# Patient Record
Sex: Male | Born: 1985 | Hispanic: No | Marital: Married | Smoking: Current every day smoker
Health system: Southern US, Community
[De-identification: ages and names within clinical notes are randomized; demographics above are authoritative.]

## PROBLEM LIST (undated history)

## (undated) DIAGNOSIS — D751 Secondary polycythemia: Principal | ICD-10-CM

## (undated) DIAGNOSIS — T7840XA Allergy, unspecified, initial encounter: Secondary | ICD-10-CM

## (undated) DIAGNOSIS — I10 Essential (primary) hypertension: Secondary | ICD-10-CM

## (undated) DIAGNOSIS — K219 Gastro-esophageal reflux disease without esophagitis: Secondary | ICD-10-CM

## (undated) HISTORY — DX: Allergy, unspecified, initial encounter: T78.40XA

## (undated) HISTORY — DX: Gastro-esophageal reflux disease without esophagitis: K21.9

## (undated) HISTORY — DX: Essential (primary) hypertension: I10

## (undated) HISTORY — PX: NASAL SEPTUM SURGERY: SHX37

## (undated) HISTORY — DX: Secondary polycythemia: D75.1

---

## 2011-04-24 ENCOUNTER — Encounter (INDEPENDENT_AMBULATORY_CARE_PROVIDER_SITE_OTHER): Payer: Self-pay | Admitting: General Surgery

## 2011-04-25 ENCOUNTER — Encounter (INDEPENDENT_AMBULATORY_CARE_PROVIDER_SITE_OTHER): Payer: Self-pay | Admitting: Surgery

## 2011-04-25 ENCOUNTER — Ambulatory Visit (INDEPENDENT_AMBULATORY_CARE_PROVIDER_SITE_OTHER): Payer: PRIVATE HEALTH INSURANCE | Admitting: Surgery

## 2011-04-25 DIAGNOSIS — D179 Benign lipomatous neoplasm, unspecified: Secondary | ICD-10-CM | POA: Insufficient documentation

## 2011-04-25 NOTE — Progress Notes (Signed)
  CC: Lump on left side HPI: The patient comes over from his primary physician with a mass on the left flank. The patient says it has been present for four or five years and he does not think it has gotten any larger over the last one year. He's not had any pain from it and it really doesn't bother him particularly at this time.   ROS: Essentially negative. He has filled out the ROS form.  MEDS: No current outpatient prescriptions on file.     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 I told him that it was safe to follow this up and if it enlarges or becomes symptomatic then we would reevaluate and likely recommend excision. He was agreeable and was not anxious for surgery at this time. He'll come back to see [place] if there are any changes in the mass or in his symptoms.  CC: Florentina Jenny, MD

## 2011-04-25 NOTE — Patient Instructions (Signed)
If the lipoma starts to cause pain or discomfort or seems to be getting bigger we should surgically remove it. Otherwise it can be left alone

## 2013-09-02 ENCOUNTER — Encounter: Payer: Self-pay | Admitting: *Deleted

## 2013-09-03 ENCOUNTER — Encounter: Payer: Self-pay | Admitting: Cardiology

## 2013-09-03 ENCOUNTER — Ambulatory Visit (INDEPENDENT_AMBULATORY_CARE_PROVIDER_SITE_OTHER): Payer: No Typology Code available for payment source | Admitting: Cardiology

## 2013-09-03 VITALS — BP 120/70 | HR 56 | Ht 69.0 in | Wt 197.0 lb

## 2013-09-03 DIAGNOSIS — R072 Precordial pain: Secondary | ICD-10-CM

## 2013-09-03 DIAGNOSIS — I209 Angina pectoris, unspecified: Secondary | ICD-10-CM

## 2013-09-03 DIAGNOSIS — R079 Chest pain, unspecified: Secondary | ICD-10-CM

## 2013-09-03 NOTE — Patient Instructions (Signed)
Your physician recommends that you schedule a follow-up appointment in: one year with Dr. Percival Spanish  We are scheduling a stress test

## 2013-09-03 NOTE — Progress Notes (Signed)
HPI The patient presents for evaluation of chest discomfort. He has no past cardiac history although he does have cardiovascular risk factors with a family history and tobacco abuse. He said that about a month ago he was having more significant chest discomfort almost daily over a couple of times daily. He did describe a pain under his left chest which was lasting for minutes area did seem to be sharp. It would be worse with activities and inspiration. It would go away spontaneously. He didn't describe associated symptoms. However, it seems to be better now is only happening a couple of times weekly. He tries to be active and doesn't always bring this on with activity. He can actually run without this coming on. He doesn't describe any resting symptoms. He has no PND or orthopnea. He's not describing palpitations, presyncope or syncope. He's had no prior cardiac workup.  No Known Allergies  Current Outpatient Prescriptions  Medication Sig Dispense Refill  . aspirin 325 MG tablet Take 325 mg by mouth daily.       No current facility-administered medications for this visit.    Past Medical History  Diagnosis Date  . Mass     on back  . Lipoma 04/25/2011    No past surgical history on file.  Family History  Problem Relation Age of Onset  . Heart disease Mother   . Diabetes Mother     History   Social History  . Marital Status: Married    Spouse Name: N/A    Number of Children: N/A  . Years of Education: N/A   Occupational History  . Not on file.   Social History Main Topics  . Smoking status: Current Every Day Smoker -- 1.00 packs/day  . Smokeless tobacco: Not on file  . Alcohol Use: No  . Drug Use: No  . Sexual Activity: Not on file   Other Topics Concern  . Not on file   Social History Narrative  . No narrative on file    ROS:  As stated in the HPI and negative for all other systems.  PHYSICAL EXAM BP 120/70  Pulse 56  Ht 5\' 9"  (1.753 m)  Wt 197 lb (89.359  kg)  BMI 29.08 kg/m2 GENERAL:  Well appearing HEENT:  Pupils equal round and reactive, fundi not visualized, oral mucosa unremarkable NECK:  No jugular venous distention, waveform within normal limits, carotid upstroke brisk and symmetric, no bruits, no thyromegaly LYMPHATICS:  No cervical, inguinal adenopathy LUNGS:  Clear to auscultation bilaterally BACK:  No CVA tenderness CHEST:  Unremarkable HEART:  PMI not displaced or sustained,S1 and S2 within normal limits, no S3, no S4, no clicks, no rubs, no murmurs ABD:  Flat, positive bowel sounds normal in frequency in pitch, no bruits, no rebound, no guarding, no midline pulsatile mass, no hepatomegaly, no splenomegaly EXT:  2 plus pulses throughout, no edema, no cyanosis no clubbing SKIN:  No rashes no nodules NEURO:  Cranial nerves II through XII grossly intact, motor grossly intact throughout PSYCH:  Cognitively intact, oriented to person place and time   EKG:  Sinus rhythm, rate 56, axis within normal limits, intervals within normal limits, no acute ST-T wave changes.  09/03/2013  ASSESSMENT AND PLAN  CHEST PAIN:  This is atypical.  However, he does have risk factors.   I will bring the patient back for a POET (Plain Old Exercise Test). This will allow me to screen for obstructive coronary disease, risk stratify and very importantly provide  a prescription for exercise.  TOBACCO ABUSE:  We discussed a specific strategy for tobacco cessation.  (Greater than three minutes discussing tobacco cessation.)

## 2013-09-16 ENCOUNTER — Telehealth (HOSPITAL_COMMUNITY): Payer: Self-pay

## 2013-09-16 NOTE — Telephone Encounter (Signed)
Encounter complete. 

## 2013-09-18 ENCOUNTER — Ambulatory Visit (HOSPITAL_COMMUNITY)
Admission: RE | Admit: 2013-09-18 | Discharge: 2013-09-18 | Disposition: A | Discharge status: Home / Self Care | Payer: No Typology Code available for payment source | Source: Ambulatory Visit | Attending: Cardiology | Admitting: Cardiology

## 2013-09-18 DIAGNOSIS — I209 Angina pectoris, unspecified: Secondary | ICD-10-CM

## 2013-09-18 DIAGNOSIS — R079 Chest pain, unspecified: Secondary | ICD-10-CM | POA: Insufficient documentation

## 2013-09-18 NOTE — Procedures (Signed)
Exercise Treadmill Test  Pre-Exercise Testing Evaluation   Test  Exercise Tolerance Test Ordering MD: Marijo File, MD    Unique Test No: 1   Treadmill:  1  Indication for ETT: chest pain - rule out ischemia  Contraindication to ETT: No   Stress Modality: exercise - treadmill  Cardiac Imaging Performed: non   Protocol: standard Bruce - maximal  Max BP:  178/96  Max MPHR (bpm):  191 85% MPR (bpm):  162  MPHR obtained (bpm):  164 % MPHR obtained:  85  Reached 85% MPHR (min:sec):  13 Total Exercise Time (min-sec):  13  Workload in METS:  1506 Borg Scale: 15  Reason ETT Terminated:  dyspnea    ST Segment Analysis At Rest: normal ST segments - no evidence of significant ST depression With Exercise: no evidence of significant ST depression  Other Information Arrhythmia:  No Angina during ETT:  absent (0) Quality of ETT:  diagnostic  ETT Interpretation:  normal - no evidence of ischemia by ST analysis  Comments: The patient had an excellent exercise tolerance.  There was no chest pain.  There was an appropriate level of dyspnea.  There were no arrhythmias, a normal heart rate response and normal BP response.  There were no ischemic ST T wave changes and a normal heart rate recovery.  Recommendations: Negative adequate ETT.  No further testing is indicated.

## 2014-04-15 ENCOUNTER — Encounter: Payer: Self-pay | Admitting: Hematology & Oncology

## 2014-04-27 ENCOUNTER — Telehealth: Payer: Self-pay | Admitting: Hematology & Oncology

## 2014-04-27 NOTE — Telephone Encounter (Signed)
I tried to call NEW PATIENT today to remind them of their appointment with Dr. Marin Olp. Also, advised them to bring all medication bottles and insurance card information.  However, someone hung up when I asked for pt. Not really sure, but I don't think they was able to understand me good.

## 2014-04-28 ENCOUNTER — Ambulatory Visit (HOSPITAL_BASED_OUTPATIENT_CLINIC_OR_DEPARTMENT_OTHER): Payer: No Typology Code available for payment source | Admitting: Hematology & Oncology

## 2014-04-28 ENCOUNTER — Encounter: Payer: Self-pay | Admitting: Hematology & Oncology

## 2014-04-28 ENCOUNTER — Other Ambulatory Visit (HOSPITAL_BASED_OUTPATIENT_CLINIC_OR_DEPARTMENT_OTHER): Payer: No Typology Code available for payment source | Admitting: Lab

## 2014-04-28 ENCOUNTER — Ambulatory Visit: Payer: No Typology Code available for payment source

## 2014-04-28 VITALS — BP 139/85 | HR 89 | Temp 98.0°F | Resp 18 | Ht 69.0 in | Wt 217.0 lb

## 2014-04-28 DIAGNOSIS — R945 Abnormal results of liver function studies: Secondary | ICD-10-CM

## 2014-04-28 DIAGNOSIS — D751 Secondary polycythemia: Secondary | ICD-10-CM

## 2014-04-28 DIAGNOSIS — R7989 Other specified abnormal findings of blood chemistry: Secondary | ICD-10-CM

## 2014-04-28 LAB — CBC WITH DIFFERENTIAL (CANCER CENTER ONLY)
BASO#: 0 10*3/uL (ref 0.0–0.2)
BASO%: 0.2 % (ref 0.0–2.0)
EOS%: 5.5 % (ref 0.0–7.0)
Eosinophils Absolute: 0.4 10*3/uL (ref 0.0–0.5)
HCT: 53.1 % — ABNORMAL HIGH (ref 38.7–49.9)
HEMOGLOBIN: 19.2 g/dL — AB (ref 13.0–17.1)
LYMPH#: 2.5 10*3/uL (ref 0.9–3.3)
LYMPH%: 31.1 % (ref 14.0–48.0)
MCH: 30.7 pg (ref 28.0–33.4)
MCHC: 36.2 g/dL — AB (ref 32.0–35.9)
MCV: 85 fL (ref 82–98)
MONO#: 0.6 10*3/uL (ref 0.1–0.9)
MONO%: 7.3 % (ref 0.0–13.0)
NEUT%: 55.9 % (ref 40.0–80.0)
NEUTROS ABS: 4.5 10*3/uL (ref 1.5–6.5)
PLATELETS: 203 10*3/uL (ref 145–400)
RBC: 6.25 10*6/uL — ABNORMAL HIGH (ref 4.20–5.70)
RDW: 14.4 % (ref 11.1–15.7)
WBC: 8.1 10*3/uL (ref 4.0–10.0)

## 2014-04-28 LAB — CMP (CANCER CENTER ONLY)
ALT: 56 U/L — AB (ref 10–47)
AST: 39 U/L — AB (ref 11–38)
Albumin: 3.7 g/dL (ref 3.3–5.5)
Alkaline Phosphatase: 99 U/L — ABNORMAL HIGH (ref 26–84)
BILIRUBIN TOTAL: 1.1 mg/dL (ref 0.20–1.60)
BUN, Bld: 11 mg/dL (ref 7–22)
CO2: 28 mEq/L (ref 18–33)
Calcium: 9.2 mg/dL (ref 8.0–10.3)
Chloride: 105 mEq/L (ref 98–108)
Creat: 0.8 mg/dl (ref 0.6–1.2)
Glucose, Bld: 98 mg/dL (ref 73–118)
Potassium: 4 mEq/L (ref 3.3–4.7)
Sodium: 145 mEq/L (ref 128–145)
Total Protein: 6.9 g/dL (ref 6.4–8.1)

## 2014-04-28 LAB — CHCC SATELLITE - SMEAR

## 2014-04-28 NOTE — Progress Notes (Signed)
Referral MD  Reason for Referral: Erythrocytosis   Chief Complaint  Patient presents with  . NEW PATIENT  : My blood count is not right.  HPI: Austin Poole is a very nice 29 year old [place] male. He's been in the [place] for 11 years. He works in Engineer, drilling.  He is seen by Dr. Alroy Dust. Dr. Alroy Dust has been doing some lab work on him. He has noted that his red cell count has been on the high side. He had a CBC done back in February. This showed a white cell count 8.1 hemoglobin 19.5 hematocrit 56 and platelet count 203,000. MCV was 89. He had a normal white cell differential.  Back in September 2015, his hemoglobin was 18.4 and hematocrit 52.6%. MCV was 88.  In June 2015, his hemoglobin was 18.2 and hematocrit was 51.4%.  In addition, Dr. Alroy Dust has found that there are some slightly elevated liver function studies. Austin Poole has never had any blood transfusions. He has no risk factors for hepatitis or HIV.  Austin Poole does smoke. He smokes about a pack per day. If he does work out quite a bit. He does not use any type of supplements or steroids to try to get big.  He does not have any pruritus. He has no headache. He has no visual issues. He has no abdominal pain. There is no nausea or vomiting. He's had no change in bowel or bladder habits.  He is not a vegetarian.  He does not drink alcohol.  We are asked to see him to try to help with the erythrocytosis.    Past Medical History  Diagnosis Date  . Lipoma 04/25/2011  :  No past surgical history on file.:   Current outpatient prescriptions:  .  aspirin 325 MG tablet, Take 325 mg by mouth daily., Disp: , Rfl: :  :  No Known Allergies:  Family History  Problem Relation Age of Onset  . CAD Mother 69    CAD/Stent  . Diabetes Mother   . CAD Father 33    MI  :  History   Social History  . Marital Status: Married    Spouse Name: N/A  . Number of Children: N/A  . Years of Education: N/A    Occupational History  . Not on file.   Social History Main Topics  . Smoking status: Current Every Day Smoker -- 1.00 packs/day for 7 years    Types: Cigarettes    Start date: 04/28/2006  . Smokeless tobacco: Never Used     Comment: 04-23-14  still smoking  . Alcohol Use: No  . Drug Use: No  . Sexual Activity: Not on file   Other Topics Concern  . Not on file   Social History Narrative  :  Pertinent items are noted in HPI.  Exam: @IPVITALS @ Well developed and well-nourished [place] gentleman. Vital signs show temperature of 98. Pulse 89. Blood pressure 139/85. Weight is 217 pounds. Head and neck exam shows no ocular or oral lesions. There are no palpable cervical or supraclavicular lymph nodes. Lungs are clear. Cardiac exam regular rate and rhythm with no murmurs, rubs or bruits. Abdomen is soft. Has good bowel sounds. There is no fluid wave. There is no palpable liver or spleen tip. Back exam shows no tenderness over the spine, ribs or hips. Extremities shows no clubbing, cyanosis or edema. Neurological exam shows no focal neurological deficits. Skin exam shows no rashes, ecchymoses or petechia.    Recent Labs  04/28/14 1211  WBC 8.1  HGB 19.2*  HCT 53.1*  PLT 203    Recent Labs  04/28/14 1211  NA 145  K 4.0  CL 105  CO2 28  GLUCOSE 98  BUN 11  CREATININE 0.8  CALCIUM 9.2    Blood smear review: normochromic and normocytic population of red blood cells. There is no nucleated red blood cells. I see no target cells. He has no teardrop cells. There is no rouleau formation. White cells appear normal in morphology maturation. There is no immature myeloid or lymphoid forms. He has no hypersegmented polys. There is no atypical lymphocytes. Platelets are adequate in number and size. He has good granulation of his platelets.    Assessment and Plan:  Austin Poole is a 29 year old [place] gentleman. He has erythrocytosis.  I wonder if this may not be from him smoking. He  really does not look like he has COPD. He is incredibly fit.  Even though he says he is not taking any supplements or steroids for weight lifting, I will still check his urine.  We will send off a JAK2 assay. I want to make sure that we are not overlooking polycythemia vera.  I'll send off an erythropoietin level and iron studies.  I spent about 45 minutes or so with he and his wife. They're both very nice.  I will try to take care of a lot of this over the phone for right now.  I am not sure why the liver function tests are elevated. I will get a CT scan to see if there is anything going on.  I told him to take a baby aspirin a day for right now. I think this would be very helpful. I think he was on a full dose aspirin but I think a baby aspirin would be appropriate.  I

## 2014-04-29 LAB — IRON AND TIBC CHCC
%SAT: 29 % (ref 20–55)
Iron: 84 ug/dL (ref 42–163)
TIBC: 286 ug/dL (ref 202–409)
UIBC: 202 ug/dL (ref 117–376)

## 2014-04-29 LAB — FERRITIN CHCC: Ferritin: 160 ng/ml (ref 22–316)

## 2014-04-30 LAB — HEMOGLOBINOPATHY EVALUATION
HGB A2 QUANT: 2.6 % (ref 2.2–3.2)
Hemoglobin Other: 0 %
Hgb A: 97.4 % (ref 96.8–97.8)
Hgb F Quant: 0 % (ref 0.0–2.0)
Hgb S Quant: 0 %

## 2014-04-30 LAB — RETICULOCYTES (CHCC)
ABS Retic: 103.2 10*3/uL (ref 19.0–186.0)
RBC.: 6.07 MIL/uL — ABNORMAL HIGH (ref 4.22–5.81)
RETIC CT PCT: 1.7 % (ref 0.4–2.3)

## 2014-04-30 LAB — ERYTHROPOIETIN: Erythropoietin: 9.6 m[IU]/mL (ref 2.6–18.5)

## 2014-05-04 ENCOUNTER — Encounter (HOSPITAL_BASED_OUTPATIENT_CLINIC_OR_DEPARTMENT_OTHER): Payer: Self-pay

## 2014-05-04 ENCOUNTER — Ambulatory Visit (HOSPITAL_BASED_OUTPATIENT_CLINIC_OR_DEPARTMENT_OTHER)
Admission: RE | Admit: 2014-05-04 | Discharge: 2014-05-04 | Disposition: A | Discharge status: Home / Self Care | Payer: No Typology Code available for payment source | Source: Ambulatory Visit | Attending: Hematology & Oncology | Admitting: Hematology & Oncology

## 2014-05-04 DIAGNOSIS — R945 Abnormal results of liver function studies: Secondary | ICD-10-CM | POA: Insufficient documentation

## 2014-05-04 DIAGNOSIS — D751 Secondary polycythemia: Secondary | ICD-10-CM

## 2014-05-04 DIAGNOSIS — R7989 Other specified abnormal findings of blood chemistry: Secondary | ICD-10-CM

## 2014-05-04 DIAGNOSIS — K760 Fatty (change of) liver, not elsewhere classified: Secondary | ICD-10-CM | POA: Insufficient documentation

## 2014-05-04 MED ORDER — IOHEXOL 300 MG/ML  SOLN
100.0000 mL | Freq: Once | INTRAMUSCULAR | Status: AC | PRN
  Administered 2014-05-04: 100 mL via INTRAVENOUS

## 2014-05-05 ENCOUNTER — Telehealth: Payer: Self-pay | Admitting: Nurse Practitioner

## 2014-05-05 LAB — ANABOLIC STEROIDS SCREEN, URINE
Bolasterone: NOT DETECTED ng/mL
Boldenone: NOT DETECTED ng/mL
CREATININE: 1648 mg/L
Clenbuterol: NOT DETECTED ng/mL
Clostebol Met.: NOT DETECTED ng/mL
Clostebol: NOT DETECTED ng/mL
Drostanolone Met.: NOT DETECTED ng/mL
Epitestosterone: 79 ng/mL
FLUOXYMESTERONE: NOT DETECTED ng/mL
METHYLTESTOSTERONE: NOT DETECTED ng/mL
Methandrostenolone Met.: NOT DETECTED ng/mL
Methandrostenolone: NOT DETECTED ng/mL
Methenolone: NOT DETECTED ng/mL
NANDROLONE: NOT DETECTED ng/mL
NORETHANDROLONE MET.: NOT DETECTED ng/mL
Nandrolone Met.: NOT DETECTED ng/mL
Norandrostenedione: NOT DETECTED ng/mL
Norethandrolone: NOT DETECTED ng/mL
Norethindrone: NOT DETECTED ng/mL
OXANDROLONE: NOT DETECTED ng/mL
OXYMETHOLONE MET.: NOT DETECTED ng/mL
PROBENECID: NOT DETECTED ng/mL
Stanozolol Met.: NOT DETECTED ng/mL
Stanozolol: NOT DETECTED ng/mL
TETRAHYDROGESTRINONE: NOT DETECTED ng/mL
TURINABOL: NOT DETECTED ng/mL
Testo / Epitest Ratio: 1.3
Testosterone: 110 ng/mL
Trenbolone Met.: NOT DETECTED ng/mL

## 2014-05-05 NOTE — Telephone Encounter (Addendum)
Spoke with wife and she verbalized understanding and appreciation.   ----- Message from Volanda Napoleon, MD sent at 05/05/2014  7:07 AM EDT ----- Call - spleen is normal in size.  You have fatty infiltration of the liver.  This is why the liver tests are elevated.  Need to see primary MD for this. No obvious cancer!!  pete

## 2014-06-03 ENCOUNTER — Encounter: Payer: Self-pay | Admitting: Nurse Practitioner

## 2014-06-08 ENCOUNTER — Other Ambulatory Visit: Payer: Self-pay

## 2014-06-08 ENCOUNTER — Ambulatory Visit: Payer: Self-pay | Admitting: Hematology & Oncology

## 2014-06-08 ENCOUNTER — Other Ambulatory Visit: Payer: Self-pay | Admitting: Hematology & Oncology

## 2014-06-08 DIAGNOSIS — D751 Secondary polycythemia: Secondary | ICD-10-CM

## 2014-06-29 ENCOUNTER — Other Ambulatory Visit: Payer: Self-pay

## 2014-06-29 ENCOUNTER — Ambulatory Visit: Payer: Self-pay | Admitting: Family

## 2015-03-09 ENCOUNTER — Other Ambulatory Visit (HOSPITAL_BASED_OUTPATIENT_CLINIC_OR_DEPARTMENT_OTHER): Payer: No Typology Code available for payment source

## 2015-03-09 ENCOUNTER — Ambulatory Visit (HOSPITAL_BASED_OUTPATIENT_CLINIC_OR_DEPARTMENT_OTHER): Payer: No Typology Code available for payment source | Admitting: Hematology & Oncology

## 2015-03-09 ENCOUNTER — Encounter: Payer: Self-pay | Admitting: Hematology & Oncology

## 2015-03-09 VITALS — BP 123/77 | HR 74 | Temp 97.8°F | Resp 16 | Ht 69.0 in | Wt 216.0 lb

## 2015-03-09 DIAGNOSIS — Z720 Tobacco use: Secondary | ICD-10-CM

## 2015-03-09 DIAGNOSIS — R7989 Other specified abnormal findings of blood chemistry: Secondary | ICD-10-CM

## 2015-03-09 DIAGNOSIS — D751 Secondary polycythemia: Secondary | ICD-10-CM

## 2015-03-09 DIAGNOSIS — R945 Abnormal results of liver function studies: Secondary | ICD-10-CM

## 2015-03-09 HISTORY — DX: Secondary polycythemia: D75.1

## 2015-03-09 LAB — COMPREHENSIVE METABOLIC PANEL
ALK PHOS: 107 U/L (ref 40–150)
ALT: 59 U/L — ABNORMAL HIGH (ref 0–55)
AST: 31 U/L (ref 5–34)
Albumin: 3.9 g/dL (ref 3.5–5.0)
Anion Gap: 10 mEq/L (ref 3–11)
BUN: 14.5 mg/dL (ref 7.0–26.0)
CALCIUM: 9.5 mg/dL (ref 8.4–10.4)
CO2: 21 mEq/L — ABNORMAL LOW (ref 22–29)
Chloride: 105 mEq/L (ref 98–109)
Creatinine: 0.8 mg/dL (ref 0.7–1.3)
Glucose: 95 mg/dl (ref 70–140)
POTASSIUM: 4 meq/L (ref 3.5–5.1)
Sodium: 137 mEq/L (ref 136–145)
Total Bilirubin: 0.62 mg/dL (ref 0.20–1.20)
Total Protein: 7.1 g/dL (ref 6.4–8.3)

## 2015-03-09 LAB — CBC WITH DIFFERENTIAL (CANCER CENTER ONLY)
BASO#: 0 10*3/uL (ref 0.0–0.2)
BASO%: 0.3 % (ref 0.0–2.0)
EOS ABS: 0.5 10*3/uL (ref 0.0–0.5)
EOS%: 6.5 % (ref 0.0–7.0)
HEMATOCRIT: 53.5 % — AB (ref 38.7–49.9)
HEMOGLOBIN: 19.2 g/dL — AB (ref 13.0–17.1)
LYMPH#: 2.7 10*3/uL (ref 0.9–3.3)
LYMPH%: 34.9 % (ref 14.0–48.0)
MCH: 30.3 pg (ref 28.0–33.4)
MCHC: 35.9 g/dL (ref 32.0–35.9)
MCV: 84 fL (ref 82–98)
MONO#: 0.7 10*3/uL (ref 0.1–0.9)
MONO%: 9.2 % (ref 0.0–13.0)
NEUT%: 49.1 % (ref 40.0–80.0)
NEUTROS ABS: 3.8 10*3/uL (ref 1.5–6.5)
Platelets: 209 10*3/uL (ref 145–400)
RBC: 6.34 10*6/uL — ABNORMAL HIGH (ref 4.20–5.70)
RDW: 14.1 % (ref 11.1–15.7)
WBC: 7.8 10*3/uL (ref 4.0–10.0)

## 2015-03-09 NOTE — Progress Notes (Signed)
Hematology and Oncology Follow Up Visit  Aldan Dudding [id number] [birth year] 30 y.o. 03/09/2015   Principle Diagnosis:   Secondary polycythemia  Current Therapy:    Aspirin 81 mg by mouth daily  Phlebotomy to maintain hematocrit below 45%.     Interim History:  Mr. Hajny is back for second office visit. We first saw him back in May of last year. At that point time, I felt that he likely had a secondary polycythemia. 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  His erythropoietin level was only 9.6. His ferritin was 160 with iron saturation of 29%.  We did do a CT of his abdomen. He has hepatic steatosis. He does not have splenomegaly. There is no adenopathy.  He is still smoking a pack per day. He also drinks an energy drink with a lot of caffeine.  He's had no headache.  I set him up with a phlebotomy last year but he had to be out of town and never made it back.  He is feeling okay. He's having no pain in the hands or feet. He's had no change in bowel or bladder habits. He's had no cough.  Overall, his performance status is ECOG 0.  Medications:  Current outpatient prescriptions:  .  aspirin 325 MG tablet, Take 325 mg by mouth daily. Reported on 03/09/2015, Disp: , Rfl:   Allergies: No Known Allergies  Past Medical History, Surgical history, Social history, and Family History were reviewed and updated.  Review of Systems: As above  Physical Exam:  height is 5\' 9"  (1.753 m) and weight is 216 lb (97.977 kg). His oral temperature is 97.8 F (36.6 C). His blood pressure is 123/77 and his pulse is 74. His respiration is 16.   Wt Readings from Last 3 Encounters:  03/09/15 216 lb (97.977 kg)  04/28/14 217 lb (98.431 kg)  09/03/13 197 lb (89.359 kg)     Well-developed and well-nourished [place] gentleman in no obvious distress. Head and neck exam shows no ocular or oral lesions. There are no palpable cervical or supraclavicular lymph nodes. He has no scleral icterus.  Lungs are clear. Cardiac exam regular rate and rhythm with no murmurs, rubs or bruits. Abdomen is soft. He has good bowel sounds. There is no fluid wave. There is no palpable liver or spleen tip. Back exam shows no tenderness over the spine, ribs or hips. Extremities shows no clubbing, cyanosis or edema. Skin exam shows no plethora. He has no rashes. He has no ecchymoses or petechia. Neurological exam shows no focal neurological deficits.  Lab Results  Component Value Date   WBC 7.8 03/09/2015   HGB 19.2* 03/09/2015   HCT 53.5* 03/09/2015   MCV 84 03/09/2015   PLT 209 03/09/2015     Chemistry      Component Value Date/Time   NA 145 04/28/2014 1211   K 4.0 04/28/2014 1211   CL 105 04/28/2014 1211   CO2 28 04/28/2014 1211   BUN 11 04/28/2014 1211   CREATININE 0.8 04/28/2014 1211      Component Value Date/Time   CALCIUM 9.2 04/28/2014 1211   ALKPHOS 99* 04/28/2014 1211   AST 39* 04/28/2014 1211   ALT 56* 04/28/2014 1211   BILITOT 1.10 04/28/2014 1211         Impression and Plan: Mr. Fahner is a 30 year old [place] gentleman. He has secondary polycythemia. I do not believe he has primary polycythemia vera. I think that the only concern would be that his  erythropoietin level is not all that high.  I told him that he really has to stop smoking. I told him that he has stopped drinking the energy drink. Over this might make his hemoglobin a little bit normalized.  I do think we have to phlebotomize him. I will set this up in 2 weeks.  I spent about 30 minutes with he and his wife. They're both very very nice. They understand why he has to stop smoking and stop drinking the energy drinks.  I told him that is very important to take the baby aspirin daily. I told him to make sure he takes it with food.  I will like to see him back in about 6 weeks.   Volanda Napoleon, MD 2/1/20171:55 PM

## 2015-03-10 LAB — HEPATITIS PANEL, ACUTE
HBsAg Screen: NEGATIVE
Hep A Ab, IgM: NEGATIVE
Hep B Core Ab, IgM: NEGATIVE

## 2015-03-10 LAB — TESTOSTERONE: Testosterone, Serum: 432 ng/dL (ref 348–1197)

## 2015-03-23 ENCOUNTER — Ambulatory Visit (HOSPITAL_BASED_OUTPATIENT_CLINIC_OR_DEPARTMENT_OTHER): Payer: No Typology Code available for payment source

## 2015-03-23 VITALS — BP 127/85 | HR 85 | Temp 97.8°F

## 2015-03-23 DIAGNOSIS — R072 Precordial pain: Secondary | ICD-10-CM

## 2015-03-23 DIAGNOSIS — D751 Secondary polycythemia: Secondary | ICD-10-CM

## 2015-03-23 NOTE — Progress Notes (Signed)
Austin Poole presents today for phlebotomy per MD orders. Phlebotomy procedure started at 1300 and ended at 1310. 500 grams removed. Patient observed for 30 minutes after procedure without any incident. Patient tolerated procedure well. IV needle removed intact.

## 2015-03-23 NOTE — Patient Instructions (Signed)

## 2015-04-21 ENCOUNTER — Other Ambulatory Visit: Payer: No Typology Code available for payment source

## 2015-04-21 ENCOUNTER — Ambulatory Visit: Payer: No Typology Code available for payment source | Admitting: Hematology & Oncology

## 2015-04-29 ENCOUNTER — Encounter: Payer: Self-pay | Admitting: Hematology & Oncology

## 2015-04-29 ENCOUNTER — Other Ambulatory Visit (HOSPITAL_BASED_OUTPATIENT_CLINIC_OR_DEPARTMENT_OTHER): Payer: No Typology Code available for payment source

## 2015-04-29 ENCOUNTER — Ambulatory Visit (HOSPITAL_BASED_OUTPATIENT_CLINIC_OR_DEPARTMENT_OTHER): Payer: No Typology Code available for payment source | Admitting: Hematology & Oncology

## 2015-04-29 ENCOUNTER — Ambulatory Visit (HOSPITAL_BASED_OUTPATIENT_CLINIC_OR_DEPARTMENT_OTHER): Payer: No Typology Code available for payment source

## 2015-04-29 VITALS — BP 117/82 | HR 74 | Temp 98.2°F | Resp 18

## 2015-04-29 VITALS — BP 143/85 | HR 75 | Temp 98.0°F | Resp 16 | Ht 69.0 in | Wt 218.0 lb

## 2015-04-29 DIAGNOSIS — D751 Secondary polycythemia: Secondary | ICD-10-CM

## 2015-04-29 DIAGNOSIS — R072 Precordial pain: Secondary | ICD-10-CM

## 2015-04-29 LAB — IRON AND TIBC
%SAT: 17 % — AB (ref 20–55)
IRON: 52 ug/dL (ref 42–163)
TIBC: 300 ug/dL (ref 202–409)
UIBC: 248 ug/dL (ref 117–376)

## 2015-04-29 LAB — CBC WITH DIFFERENTIAL (CANCER CENTER ONLY)
BASO#: 0 10*3/uL (ref 0.0–0.2)
BASO%: 0.4 % (ref 0.0–2.0)
EOS ABS: 0.5 10*3/uL (ref 0.0–0.5)
EOS%: 6.1 % (ref 0.0–7.0)
HEMATOCRIT: 51.4 % — AB (ref 38.7–49.9)
HEMOGLOBIN: 18.1 g/dL — AB (ref 13.0–17.1)
LYMPH#: 3.1 10*3/uL (ref 0.9–3.3)
LYMPH%: 36.9 % (ref 14.0–48.0)
MCH: 30.3 pg (ref 28.0–33.4)
MCHC: 35.2 g/dL (ref 32.0–35.9)
MCV: 86 fL (ref 82–98)
MONO#: 0.8 10*3/uL (ref 0.1–0.9)
MONO%: 9.8 % (ref 0.0–13.0)
NEUT%: 46.8 % (ref 40.0–80.0)
NEUTROS ABS: 4 10*3/uL (ref 1.5–6.5)
Platelets: 215 10*3/uL (ref 145–400)
RBC: 5.97 10*6/uL — ABNORMAL HIGH (ref 4.20–5.70)
RDW: 14 % (ref 11.1–15.7)
WBC: 8.4 10*3/uL (ref 4.0–10.0)

## 2015-04-29 LAB — FERRITIN: Ferritin: 80 ng/ml (ref 22–316)

## 2015-04-29 NOTE — Progress Notes (Signed)
Hematology and Oncology Follow Up Visit  Austin Poole [id number] August 10, 1985 29 y.o. 04/29/2015   Principle Diagnosis:   Secondary polycythemia  Current Therapy:    Aspirin 81 mg by mouth daily  Phlebotomy to maintain hematocrit below 48%.     Interim History:  [name] is back for follow-up. He is feeling better. He was phlebotomized the last time we saw him. He felt better afterwards.  He is trying to work out more.  He is still drinking energy drinks. I think he is still smoking but trying to stop.  He's had no bleeding. He's had no nausea or vomiting. He's had no chest pain. He's had no joint issues. He's had no headache.  He is still working without difficulties.  Overall, his performance status is ECOG 0.  Medications:  Current outpatient prescriptions:  .  aspirin 325 MG tablet, Take 325 mg by mouth daily. Reported on 03/09/2015, Disp: , Rfl:   Allergies: No Known Allergies  Past Medical History, Surgical history, Social history, and Family History were reviewed and updated.  Review of Systems: As above  Physical Exam:  height is 5\' 9"  (1.753 m) and weight is 218 lb (98.884 kg). His oral temperature is 98 F (36.7 C). His blood pressure is 143/85 and his pulse is 75. His respiration is 16.   Wt Readings from Last 3 Encounters:  04/29/15 218 lb (98.884 kg)  03/09/15 216 lb (97.977 kg)  04/28/14 217 lb (98.431 kg)     Well-developed and well-nourished [place] gentleman in no obvious distress. Head and neck exam shows no ocular or oral lesions. There are no palpable cervical or supraclavicular lymph nodes. He has no scleral icterus. Lungs are clear. Cardiac exam regular rate and rhythm with no murmurs, rubs or bruits. Abdomen is soft. He has good bowel sounds. There is no fluid wave. There is no palpable liver or spleen tip. Back exam shows no tenderness over the spine, ribs or hips. Extremities shows no clubbing, cyanosis or edema. Skin exam shows no plethora. He  has no rashes. He has no ecchymoses or petechia. Neurological exam shows no focal neurological deficits.  Lab Results  Component Value Date   WBC 8.4 04/29/2015   HGB 18.1* 04/29/2015   HCT 51.4* 04/29/2015   MCV 86 04/29/2015   PLT 215 04/29/2015     Chemistry      Component Value Date/Time   NA 137 03/09/2015 1256   NA 145 04/28/2014 1211   K 4.0 03/09/2015 1256   K 4.0 04/28/2014 1211   CL 105 04/28/2014 1211   CO2 21* 03/09/2015 1256   CO2 28 04/28/2014 1211   BUN 14.5 03/09/2015 1256   BUN 11 04/28/2014 1211   CREATININE 0.8 03/09/2015 1256   CREATININE 0.8 04/28/2014 1211      Component Value Date/Time   CALCIUM 9.5 03/09/2015 1256   CALCIUM 9.2 04/28/2014 1211   ALKPHOS 107 03/09/2015 1256   ALKPHOS 99* 04/28/2014 1211   AST 31 03/09/2015 1256   AST 39* 04/28/2014 1211   ALT 59* 03/09/2015 1256   ALT 56* 04/28/2014 1211   BILITOT 0.62 03/09/2015 1256   BILITOT 1.10 04/28/2014 1211         Impression and Plan: Austin Poole is a 30 year old [place] gentleman. He has secondary polycythemia. I do not believe he has primary polycythemia vera. I think that the only concern would be that his erythropoietin level is not all that high.  I told him that he  really has to stop smoking. I told him that he has stopped drinking the energy drink. I believe that this might make his hemoglobin a little bit normalized.  I do think we have to phlebotomize him. I will set this up to be done today.   I spent about 30 minutes with him today.Virl Axe both very very nice. They understand why he has to stop smoking and stop drinking the energy drinks.  I told him that is very important to take the baby aspirin daily. I told him to make sure he takes it with food.  I will like to see him back in about 6 weeks.   Volanda Napoleon, MD 3/24/201710:19 AM

## 2015-04-29 NOTE — Patient Instructions (Signed)

## 2015-04-30 LAB — RETICULOCYTES: RETICULOCYTE COUNT: 1.4 % (ref 0.6–2.6)

## 2015-06-01 ENCOUNTER — Ambulatory Visit (HOSPITAL_BASED_OUTPATIENT_CLINIC_OR_DEPARTMENT_OTHER): Payer: No Typology Code available for payment source | Admitting: Family

## 2015-06-01 ENCOUNTER — Ambulatory Visit (HOSPITAL_BASED_OUTPATIENT_CLINIC_OR_DEPARTMENT_OTHER): Payer: No Typology Code available for payment source

## 2015-06-01 ENCOUNTER — Encounter: Payer: Self-pay | Admitting: Family

## 2015-06-01 ENCOUNTER — Other Ambulatory Visit (HOSPITAL_BASED_OUTPATIENT_CLINIC_OR_DEPARTMENT_OTHER): Payer: No Typology Code available for payment source

## 2015-06-01 VITALS — BP 119/72 | HR 74

## 2015-06-01 VITALS — BP 125/86 | HR 80 | Temp 98.4°F | Resp 16 | Ht 69.0 in | Wt 215.0 lb

## 2015-06-01 DIAGNOSIS — F17200 Nicotine dependence, unspecified, uncomplicated: Secondary | ICD-10-CM

## 2015-06-01 DIAGNOSIS — R072 Precordial pain: Secondary | ICD-10-CM

## 2015-06-01 DIAGNOSIS — D751 Secondary polycythemia: Secondary | ICD-10-CM

## 2015-06-01 LAB — COMPREHENSIVE METABOLIC PANEL
ALBUMIN: 3.8 g/dL (ref 3.5–5.0)
ALK PHOS: 99 U/L (ref 40–150)
ALT: 58 U/L — AB (ref 0–55)
AST: 33 U/L (ref 5–34)
Anion Gap: 7 mEq/L (ref 3–11)
BILIRUBIN TOTAL: 0.85 mg/dL (ref 0.20–1.20)
BUN: 11.1 mg/dL (ref 7.0–26.0)
CO2: 22 meq/L (ref 22–29)
CREATININE: 0.8 mg/dL (ref 0.7–1.3)
Calcium: 9 mg/dL (ref 8.4–10.4)
Chloride: 105 mEq/L (ref 98–109)
Glucose: 85 mg/dl (ref 70–140)
Potassium: 4.1 mEq/L (ref 3.5–5.1)
SODIUM: 134 meq/L — AB (ref 136–145)
TOTAL PROTEIN: 6.8 g/dL (ref 6.4–8.3)

## 2015-06-01 LAB — CBC WITH DIFFERENTIAL (CANCER CENTER ONLY)
BASO#: 0 10*3/uL (ref 0.0–0.2)
BASO%: 0.3 % (ref 0.0–2.0)
EOS%: 5.1 % (ref 0.0–7.0)
Eosinophils Absolute: 0.5 10*3/uL (ref 0.0–0.5)
HCT: 51.6 % — ABNORMAL HIGH (ref 38.7–49.9)
HGB: 18.5 g/dL — ABNORMAL HIGH (ref 13.0–17.1)
LYMPH#: 3.4 10*3/uL — ABNORMAL HIGH (ref 0.9–3.3)
LYMPH%: 36.9 % (ref 14.0–48.0)
MCH: 30.4 pg (ref 28.0–33.4)
MCHC: 35.9 g/dL (ref 32.0–35.9)
MCV: 85 fL (ref 82–98)
MONO#: 0.9 10*3/uL (ref 0.1–0.9)
MONO%: 10.2 % (ref 0.0–13.0)
NEUT#: 4.3 10*3/uL (ref 1.5–6.5)
NEUT%: 47.5 % (ref 40.0–80.0)
PLATELETS: 212 10*3/uL (ref 145–400)
RBC: 6.09 10*6/uL — ABNORMAL HIGH (ref 4.20–5.70)
RDW: 14.3 % (ref 11.1–15.7)
WBC: 9.1 10*3/uL (ref 4.0–10.0)

## 2015-06-01 LAB — IRON AND TIBC
%SAT: 21 % (ref 20–55)
Iron: 68 ug/dL (ref 42–163)
TIBC: 328 ug/dL (ref 202–409)
UIBC: 260 ug/dL (ref 117–376)

## 2015-06-01 LAB — FERRITIN: Ferritin: 48 ng/ml (ref 22–316)

## 2015-06-01 NOTE — Progress Notes (Signed)
Austin Poole presents today for phlebotomy per MD orders. Phlebotomy procedure started at 1205 and ended at 1215. 500 grams removed. Patient observed for 30 minutes after procedure without any incident. Patient tolerated procedure well. IV needle removed intact.

## 2015-06-01 NOTE — Patient Instructions (Signed)

## 2015-06-01 NOTE — Progress Notes (Signed)
Hematology and Oncology Follow Up Visit  Akzel Poole [id number] 1985-05-03 30 y.o. 06/01/2015   Principle Diagnosis:  Secondary polycythemia  Current Therapy:   Aspirin 81 mg by mouth daily Phlebotomy to maintain hematocrit below 48%    Interim History:  Austin Poole is here today for follow-up. He is still feeling quite fatigued and has not been able to go to the gym or lift weights like he likes.  He did well with his last phlebotomy was a month ago. His Hct is still 51.6.  He is resting well at night and has no trouble falling asleep.  No fever, chills, n/v, cough, rash, dizziness, SOB, chest pain, palpitations, abdominal pain or changes in bowel or bladder habits.  No swelling, tenderness, numbness or tingling in her extremities. No c/o joint aches and pains.  He has maintained a good appetite and is staying well hydrated.   Medications:    Medication List       This list is accurate as of: 06/01/15 11:42 AM.  Always use your most recent med list.               aspirin 325 MG tablet  Take 325 mg by mouth daily. Reported on 03/09/2015        Allergies: No Known Allergies  Past Medical History, Surgical history, Social history, and Family History were reviewed and updated.  Review of Systems: All other 10 point review of systems is negative.   Physical Exam:  height is 5\' 9"  (1.753 m) and weight is 215 lb (97.523 kg). His oral temperature is 98.4 F (36.9 C). His blood pressure is 125/86 and his pulse is 80. His respiration is 16.   Wt Readings from Last 3 Encounters:  06/01/15 215 lb (97.523 kg)  04/29/15 218 lb (98.884 kg)  03/09/15 216 lb (97.977 kg)    Ocular: Sclerae unicteric, pupils equal, round and reactive to light Ear-nose-throat: Oropharynx clear, dentition fair Lymphatic: No cervical supraclavicular or axillary adenopathy Lungs no rales or rhonchi, good excursion bilaterally Heart regular rate and rhythm, no murmur appreciated Abd soft, nontender,  positive bowel sounds, no liver or spleen tip palpated on exam, no fluid wave MSK no focal spinal tenderness, no joint edema Neuro: non-focal, well-oriented, appropriate affect Breasts: Deferred  Lab Results  Component Value Date   WBC 9.1 06/01/2015   HGB 18.5* 06/01/2015   HCT 51.6* 06/01/2015   MCV 85 06/01/2015   PLT 212 06/01/2015   Lab Results  Component Value Date   FERRITIN 80 04/29/2015   IRON 52 04/29/2015   TIBC 300 04/29/2015   UIBC 248 04/29/2015   IRONPCTSAT 17* 04/29/2015   Lab Results  Component Value Date   RETICCTPCT 1.7 04/28/2014   RBC 6.09* 06/01/2015   RETICCTABS 103.2 04/28/2014   No results found for: KPAFRELGTCHN, LAMBDASER, KAPLAMBRATIO No results found for: IGGSERUM, IGA, IGMSERUM No results found for: Odetta Pink, SPEI   Chemistry      Component Value Date/Time   NA 137 03/09/2015 1256   NA 145 04/28/2014 1211   K 4.0 03/09/2015 1256   K 4.0 04/28/2014 1211   CL 105 04/28/2014 1211   CO2 21* 03/09/2015 1256   CO2 28 04/28/2014 1211   BUN 14.5 03/09/2015 1256   BUN 11 04/28/2014 1211   CREATININE 0.8 03/09/2015 1256   CREATININE 0.8 04/28/2014 1211      Component Value Date/Time   CALCIUM 9.5 03/09/2015 1256  CALCIUM 9.2 04/28/2014 1211   ALKPHOS 107 03/09/2015 1256   ALKPHOS 99* 04/28/2014 1211   AST 31 03/09/2015 1256   AST 39* 04/28/2014 1211   ALT 59* 03/09/2015 1256   ALT 56* 04/28/2014 1211   BILITOT 0.62 03/09/2015 1256   BILITOT 1.10 04/28/2014 1211     Impression and Plan: Austin Poole is a 30 yo [place] gentleman with secondary polycythemia due to smoking. He is trying to quite. He is feeling quite fatigued at this time but is otherwise asymptomatic.  His Hct is still elevated at 51.6. We will get him on a phlebotomy program once weekly for 3 weeks.  We will then plan to see him back in 1 month for repeat lab work and follow-up.  He will contact our office with any  questions or concerns. We can certainly see him sooner if need be.   Austin Bottom, NP 4/26/201711:42 AM

## 2015-06-08 ENCOUNTER — Ambulatory Visit (HOSPITAL_BASED_OUTPATIENT_CLINIC_OR_DEPARTMENT_OTHER): Payer: No Typology Code available for payment source

## 2015-06-08 VITALS — BP 122/73 | HR 80

## 2015-06-08 DIAGNOSIS — R072 Precordial pain: Secondary | ICD-10-CM

## 2015-06-08 DIAGNOSIS — D751 Secondary polycythemia: Secondary | ICD-10-CM

## 2015-06-08 NOTE — Patient Instructions (Signed)

## 2015-06-08 NOTE — Progress Notes (Signed)
Austin Poole presents today for phlebotomy per MD orders. Phlebotomy procedure started at 1045 and ended at 1055. 500 grams removed. Patient observed for 30 minutes after procedure without any incident. Patient tolerated procedure well. IV needle removed intact.

## 2015-06-15 ENCOUNTER — Ambulatory Visit (HOSPITAL_BASED_OUTPATIENT_CLINIC_OR_DEPARTMENT_OTHER): Payer: No Typology Code available for payment source

## 2015-06-15 VITALS — BP 126/73 | HR 94 | Temp 98.4°F | Resp 20

## 2015-06-15 DIAGNOSIS — D751 Secondary polycythemia: Secondary | ICD-10-CM

## 2015-06-15 DIAGNOSIS — R072 Precordial pain: Secondary | ICD-10-CM

## 2015-06-15 NOTE — Progress Notes (Signed)
Austin Poole presents today for phlebotomy per MD orders. Phlebotomy procedure started at 1120 and ended at 1130. 500 grams removed. Patient observed for 30 minutes after procedure without any incident. Patient tolerated procedure well. IV needle removed intact.

## 2015-06-15 NOTE — Patient Instructions (Signed)

## 2015-06-30 ENCOUNTER — Other Ambulatory Visit (HOSPITAL_BASED_OUTPATIENT_CLINIC_OR_DEPARTMENT_OTHER): Payer: No Typology Code available for payment source

## 2015-06-30 ENCOUNTER — Encounter: Payer: Self-pay | Admitting: Hematology & Oncology

## 2015-06-30 ENCOUNTER — Ambulatory Visit (HOSPITAL_BASED_OUTPATIENT_CLINIC_OR_DEPARTMENT_OTHER): Payer: No Typology Code available for payment source | Admitting: Hematology & Oncology

## 2015-06-30 VITALS — BP 134/71 | HR 73 | Temp 97.6°F | Resp 16 | Ht 69.0 in | Wt 214.0 lb

## 2015-06-30 DIAGNOSIS — D751 Secondary polycythemia: Secondary | ICD-10-CM

## 2015-06-30 LAB — COMPREHENSIVE METABOLIC PANEL
ALBUMIN: 3.8 g/dL (ref 3.5–5.0)
ALK PHOS: 103 U/L (ref 40–150)
ALT: 44 U/L (ref 0–55)
AST: 36 U/L — ABNORMAL HIGH (ref 5–34)
Anion Gap: 8 mEq/L (ref 3–11)
BILIRUBIN TOTAL: 1.04 mg/dL (ref 0.20–1.20)
BUN: 8.9 mg/dL (ref 7.0–26.0)
CALCIUM: 9.4 mg/dL (ref 8.4–10.4)
CO2: 23 mEq/L (ref 22–29)
Chloride: 107 mEq/L (ref 98–109)
Creatinine: 0.9 mg/dL (ref 0.7–1.3)
GLUCOSE: 87 mg/dL (ref 70–140)
POTASSIUM: 4.2 meq/L (ref 3.5–5.1)
Sodium: 138 mEq/L (ref 136–145)
TOTAL PROTEIN: 6.7 g/dL (ref 6.4–8.3)

## 2015-06-30 LAB — CBC WITH DIFFERENTIAL (CANCER CENTER ONLY)
BASO#: 0 10*3/uL (ref 0.0–0.2)
BASO%: 0.2 % (ref 0.0–2.0)
EOS ABS: 0.4 10*3/uL (ref 0.0–0.5)
EOS%: 4.9 % (ref 0.0–7.0)
HEMATOCRIT: 45.7 % (ref 38.7–49.9)
HEMOGLOBIN: 15.8 g/dL (ref 13.0–17.1)
LYMPH#: 2.9 10*3/uL (ref 0.9–3.3)
LYMPH%: 36.6 % (ref 14.0–48.0)
MCH: 29 pg (ref 28.0–33.4)
MCHC: 34.6 g/dL (ref 32.0–35.9)
MCV: 84 fL (ref 82–98)
MONO#: 0.8 10*3/uL (ref 0.1–0.9)
MONO%: 10.5 % (ref 0.0–13.0)
NEUT%: 47.8 % (ref 40.0–80.0)
NEUTROS ABS: 3.8 10*3/uL (ref 1.5–6.5)
Platelets: 253 10*3/uL (ref 145–400)
RBC: 5.44 10*6/uL (ref 4.20–5.70)
RDW: 13.2 % (ref 11.1–15.7)
WBC: 8 10*3/uL (ref 4.0–10.0)

## 2015-06-30 LAB — FERRITIN: FERRITIN: 15 ng/mL — AB (ref 22–316)

## 2015-06-30 LAB — IRON AND TIBC
%SAT: 23 % (ref 20–55)
IRON: 81 ug/dL (ref 42–163)
TIBC: 345 ug/dL (ref 202–409)
UIBC: 265 ug/dL (ref 117–376)

## 2015-06-30 NOTE — Progress Notes (Signed)
Hematology and Oncology Follow Up Visit  Zayyan Trimarco [id number] 26-Oct-1985 30 y.o. 06/30/2015   Principle Diagnosis:   Secondary polycythemia  Current Therapy:    Aspirin 81 mg by mouth daily  Phlebotomy to maintain hematocrit below 48%.     Interim History:  Mr. Lozier is back for follow-up. He is feeling better. He was phlebotomized the last time we saw him. He felt better afterwards.  He is trying to work out more.  He is still drinking energy drinks. I think he is still smoking but trying to stop.  He's had no bleeding. He's had no nausea or vomiting. He's had no chest pain. He's had no joint issues. He's had no headache.  He is still working without difficulties.  Overall, his performance status is ECOG 0.  Medications:  Current outpatient prescriptions:  .  aspirin 325 MG tablet, Take 325 mg by mouth daily. Reported on 03/09/2015, Disp: , Rfl:   Allergies: No Known Allergies  Past Medical History, Surgical history, Social history, and Family History were reviewed and updated.  Review of Systems: As above  Physical Exam:  height is 5\' 9"  (1.753 m) and weight is 214 lb (97.07 kg). His oral temperature is 97.6 F (36.4 C). His blood pressure is 134/71 and his pulse is 73. His respiration is 16.   Wt Readings from Last 3 Encounters:  06/30/15 214 lb (97.07 kg)  06/01/15 215 lb (97.523 kg)  04/29/15 218 lb (98.884 kg)     Well-developed and well-nourished [place] gentleman in no obvious distress. Head and neck exam shows no ocular or oral lesions. There are no palpable cervical or supraclavicular lymph nodes. He has no scleral icterus. Lungs are clear. Cardiac exam regular rate and rhythm with no murmurs, rubs or bruits. Abdomen is soft. He has good bowel sounds. There is no fluid wave. There is no palpable liver or spleen tip. Back exam shows no tenderness over the spine, ribs or hips. Extremities shows no clubbing, cyanosis or edema. Skin exam shows no plethora. He  has no rashes. He has no ecchymoses or petechia. Neurological exam shows no focal neurological deficits.  Lab Results  Component Value Date   WBC 8.0 06/30/2015   HGB 15.8 06/30/2015   HCT 45.7 06/30/2015   MCV 84 06/30/2015   PLT 253 06/30/2015     Chemistry      Component Value Date/Time   NA 138 06/30/2015 1045   NA 145 04/28/2014 1211   K 4.2 06/30/2015 1045   K 4.0 04/28/2014 1211   CL 105 04/28/2014 1211   CO2 23 06/30/2015 1045   CO2 28 04/28/2014 1211   BUN 8.9 06/30/2015 1045   BUN 11 04/28/2014 1211   CREATININE 0.9 06/30/2015 1045   CREATININE 0.8 04/28/2014 1211      Component Value Date/Time   CALCIUM 9.4 06/30/2015 1045   CALCIUM 9.2 04/28/2014 1211   ALKPHOS 103 06/30/2015 1045   ALKPHOS 99* 04/28/2014 1211   AST 36* 06/30/2015 1045   AST 39* 04/28/2014 1211   ALT 44 06/30/2015 1045   ALT 56* 04/28/2014 1211   BILITOT 1.04 06/30/2015 1045   BILITOT 1.10 04/28/2014 1211         Impression and Plan: Mr. Mastalski is a 30 year old [place] gentleman. He has secondary polycythemia. I do not believe he has primary polycythemia vera. I think that the only concern would be that his erythropoietin level is not all that high.  He is cutting back on  his smoking. He is working out more. I still think he takes some energy drinks.  We do not have to phlebotomize him today.  I think we probably get him back in a month or so.   Volanda Napoleon, MD 5/25/20172:33 PM

## 2015-07-01 LAB — RETICULOCYTES: Reticulocyte Count: 1.5 % (ref 0.6–2.6)

## 2015-08-01 ENCOUNTER — Ambulatory Visit: Payer: No Typology Code available for payment source | Admitting: Hematology & Oncology

## 2015-08-01 ENCOUNTER — Other Ambulatory Visit: Payer: No Typology Code available for payment source

## 2015-08-10 ENCOUNTER — Ambulatory Visit: Payer: No Typology Code available for payment source | Admitting: Hematology & Oncology

## 2015-08-10 ENCOUNTER — Other Ambulatory Visit: Payer: No Typology Code available for payment source

## 2015-08-17 ENCOUNTER — Other Ambulatory Visit (HOSPITAL_BASED_OUTPATIENT_CLINIC_OR_DEPARTMENT_OTHER): Payer: No Typology Code available for payment source

## 2015-08-17 ENCOUNTER — Ambulatory Visit (HOSPITAL_BASED_OUTPATIENT_CLINIC_OR_DEPARTMENT_OTHER): Payer: No Typology Code available for payment source

## 2015-08-17 ENCOUNTER — Ambulatory Visit (HOSPITAL_BASED_OUTPATIENT_CLINIC_OR_DEPARTMENT_OTHER): Payer: No Typology Code available for payment source | Admitting: Family

## 2015-08-17 VITALS — BP 125/69 | HR 74 | Temp 97.6°F | Wt 214.0 lb

## 2015-08-17 VITALS — BP 119/92 | HR 79 | Resp 20

## 2015-08-17 DIAGNOSIS — D751 Secondary polycythemia: Secondary | ICD-10-CM

## 2015-08-17 DIAGNOSIS — Z720 Tobacco use: Secondary | ICD-10-CM

## 2015-08-17 DIAGNOSIS — D571 Sickle-cell disease without crisis: Secondary | ICD-10-CM

## 2015-08-17 LAB — COMPREHENSIVE METABOLIC PANEL
ALBUMIN: 3.8 g/dL (ref 3.5–5.0)
ALK PHOS: 126 U/L (ref 40–150)
ALT: 40 U/L (ref 0–55)
AST: 25 U/L (ref 5–34)
Anion Gap: 9 mEq/L (ref 3–11)
BILIRUBIN TOTAL: 0.56 mg/dL (ref 0.20–1.20)
BUN: 16.2 mg/dL (ref 7.0–26.0)
CALCIUM: 9.3 mg/dL (ref 8.4–10.4)
CO2: 21 mEq/L — ABNORMAL LOW (ref 22–29)
Chloride: 107 mEq/L (ref 98–109)
Creatinine: 1 mg/dL (ref 0.7–1.3)
GLUCOSE: 106 mg/dL (ref 70–140)
Potassium: 4.4 mEq/L (ref 3.5–5.1)
SODIUM: 137 meq/L (ref 136–145)
TOTAL PROTEIN: 7 g/dL (ref 6.4–8.3)

## 2015-08-17 LAB — CBC WITH DIFFERENTIAL (CANCER CENTER ONLY)
BASO#: 0 10*3/uL (ref 0.0–0.2)
BASO%: 0.2 % (ref 0.0–2.0)
EOS%: 4.1 % (ref 0.0–7.0)
Eosinophils Absolute: 0.4 10*3/uL (ref 0.0–0.5)
HEMATOCRIT: 48.4 % (ref 38.7–49.9)
HGB: 16.6 g/dL (ref 13.0–17.1)
LYMPH#: 3 10*3/uL (ref 0.9–3.3)
LYMPH%: 34.8 % (ref 14.0–48.0)
MCH: 27.4 pg — ABNORMAL LOW (ref 28.0–33.4)
MCHC: 34.3 g/dL (ref 32.0–35.9)
MCV: 80 fL — ABNORMAL LOW (ref 82–98)
MONO#: 0.7 10*3/uL (ref 0.1–0.9)
MONO%: 7.9 % (ref 0.0–13.0)
NEUT#: 4.6 10*3/uL (ref 1.5–6.5)
NEUT%: 53 % (ref 40.0–80.0)
PLATELETS: 231 10*3/uL (ref 145–400)
RBC: 6.05 10*6/uL — ABNORMAL HIGH (ref 4.20–5.70)
RDW: 14.6 % (ref 11.1–15.7)
WBC: 8.6 10*3/uL (ref 4.0–10.0)

## 2015-08-17 NOTE — Progress Notes (Signed)
Hematology and Oncology Follow Up Visit  Austin Poole [id number] Apr 16, 1985 29 y.o. 08/17/2015   Principle Diagnosis:  Secondary polycythemia  Current Therapy:   Aspirin 325 mg by mouth daily Phlebotomy to maintain hematocrit below 48%    Interim History:  Austin Poole is here today for follow-up. He is doing well and asks if he would be able to start going back to the gym. This should be fine.  His BP is 125/69 with a HR of 74.  His last phlebotomy was in May. His Hct today is 48.4.  He is taking a full dose aspirin daily. He did cut his finger and it took about 15 minutes but he was able to stop the bleeding. No other episodes of bleeding or bruising.  He is resting well at night and has no trouble falling asleep.  No fever, chills, n/v, cough, rash, dizziness, headache, blurred vision, SOB, chest pain, palpitations, abdominal pain or changes in bowel or bladder habits.  No lymphadenopathy found on exam.  No swelling, tenderness, numbness or tingling in her extremities. No c/o joint aches and pains.  He has maintained a good appetite and is staying well hydrated.   Medications:    Medication List       This list is accurate as of: 08/17/15  1:29 PM.  Always use your most recent med list.               aspirin 325 MG tablet  Take 325 mg by mouth daily. Reported on 03/09/2015        Allergies: No Known Allergies  Past Medical History, Surgical history, Social history, and Family History were reviewed and updated.  Review of Systems: All other 10 point review of systems is negative.   Physical Exam:  weight is 214 lb (97.07 kg). His oral temperature is 97.6 F (36.4 C). His blood pressure is 125/69 and his pulse is 74.   Wt Readings from Last 3 Encounters:  08/17/15 214 lb (97.07 kg)  06/30/15 214 lb (97.07 kg)  06/01/15 215 lb (97.523 kg)    Ocular: Sclerae unicteric, pupils equal, round and reactive to light Ear-nose-throat: Oropharynx clear, dentition  fair Lymphatic: No cervical supraclavicular or axillary adenopathy Lungs no rales or rhonchi, good excursion bilaterally Heart regular rate and rhythm, no murmur appreciated Abd soft, nontender, positive bowel sounds, no liver or spleen tip palpated on exam, no fluid wave MSK no focal spinal tenderness, no joint edema Neuro: non-focal, well-oriented, appropriate affect Breasts: Deferred  Lab Results  Component Value Date   WBC 8.6 08/17/2015   HGB 16.6 08/17/2015   HCT 48.4 08/17/2015   MCV 80* 08/17/2015   PLT 231 08/17/2015   Lab Results  Component Value Date   FERRITIN 15* 06/30/2015   IRON 81 06/30/2015   TIBC 345 06/30/2015   UIBC 265 06/30/2015   IRONPCTSAT 23 06/30/2015   Lab Results  Component Value Date   RETICCTPCT 1.7 04/28/2014   RBC 6.05* 08/17/2015   RETICCTABS 103.2 04/28/2014   No results found for: KPAFRELGTCHN, LAMBDASER, KAPLAMBRATIO No results found for: IGGSERUM, IGA, IGMSERUM No results found for: [name], A1GS, Nelida Meuse, SPEI   Chemistry      Component Value Date/Time   NA 138 06/30/2015 1045   NA 145 04/28/2014 1211   K 4.2 06/30/2015 1045   K 4.0 04/28/2014 1211   CL 105 04/28/2014 1211   CO2 23 06/30/2015 1045   CO2 28 04/28/2014 1211  BUN 8.9 06/30/2015 1045   BUN 11 04/28/2014 1211   CREATININE 0.9 06/30/2015 1045   CREATININE 0.8 04/28/2014 1211      Component Value Date/Time   CALCIUM 9.4 06/30/2015 1045   CALCIUM 9.2 04/28/2014 1211   ALKPHOS 103 06/30/2015 1045   ALKPHOS 99* 04/28/2014 1211   AST 36* 06/30/2015 1045   AST 39* 04/28/2014 1211   ALT 44 06/30/2015 1045   ALT 56* 04/28/2014 1211   BILITOT 1.04 06/30/2015 1045   BILITOT 1.10 04/28/2014 1211     Impression and Plan: Austin Poole is a 30 yo [place] gentleman with secondary polycythemia due to smoking. He is still trying to quite. He is doing well and has no complaints at this time.  His Hct is 48.4. We will phlebotomize him  today.  We will then plan to see him back in 2 months for repeat lab work and follow-up.  He will contact our office with any questions or concerns. We can certainly see him sooner if need be.   Eliezer Bottom, NP 7/12/20171:29 PM

## 2015-08-17 NOTE — Progress Notes (Signed)
Ricke Hey presents today for phlebotomy per MD orders. Phlebotomy procedure started at 1345 and ended at 1355. 500 grams removed. Patient observed for 30 minutes after procedure without any incident. Patient tolerated procedure well. IV needle removed intact.

## 2015-08-17 NOTE — Patient Instructions (Signed)

## 2015-08-18 LAB — IRON AND TIBC
%SAT: 11 % — ABNORMAL LOW (ref 20–55)
Iron: 40 ug/dL — ABNORMAL LOW (ref 42–163)
TIBC: 363 ug/dL (ref 202–409)
UIBC: 323 ug/dL (ref 117–376)

## 2015-08-18 LAB — FERRITIN: Ferritin: 12 ng/ml — ABNORMAL LOW (ref 22–316)

## 2015-10-19 ENCOUNTER — Ambulatory Visit: Payer: No Typology Code available for payment source | Admitting: Hematology & Oncology

## 2015-10-19 ENCOUNTER — Other Ambulatory Visit: Payer: No Typology Code available for payment source

## 2015-11-16 ENCOUNTER — Ambulatory Visit (HOSPITAL_BASED_OUTPATIENT_CLINIC_OR_DEPARTMENT_OTHER): Payer: No Typology Code available for payment source | Admitting: Hematology & Oncology

## 2015-11-16 ENCOUNTER — Other Ambulatory Visit (HOSPITAL_BASED_OUTPATIENT_CLINIC_OR_DEPARTMENT_OTHER): Payer: No Typology Code available for payment source

## 2015-11-16 ENCOUNTER — Encounter: Payer: Self-pay | Admitting: Hematology & Oncology

## 2015-11-16 ENCOUNTER — Ambulatory Visit (HOSPITAL_BASED_OUTPATIENT_CLINIC_OR_DEPARTMENT_OTHER): Payer: No Typology Code available for payment source

## 2015-11-16 VITALS — BP 143/86 | HR 96 | Temp 97.6°F | Resp 16 | Ht 69.0 in | Wt 219.4 lb

## 2015-11-16 VITALS — BP 123/72 | HR 84 | Temp 98.2°F | Resp 16

## 2015-11-16 DIAGNOSIS — D751 Secondary polycythemia: Secondary | ICD-10-CM

## 2015-11-16 DIAGNOSIS — R072 Precordial pain: Secondary | ICD-10-CM

## 2015-11-16 DIAGNOSIS — Z862 Personal history of diseases of the blood and blood-forming organs and certain disorders involving the immune mechanism: Secondary | ICD-10-CM

## 2015-11-16 LAB — COMPREHENSIVE METABOLIC PANEL (CC13)
ALBUMIN: 4.1 g/dL (ref 3.5–5.5)
ALT: 89 IU/L — ABNORMAL HIGH (ref 0–44)
AST (SGOT): 77 IU/L — ABNORMAL HIGH (ref 0–40)
Albumin/Globulin Ratio: 1.5 (ref 1.2–2.2)
Alkaline Phosphatase, S: 121 IU/L — ABNORMAL HIGH (ref 39–117)
BUN / CREAT RATIO: 13 (ref 9–20)
BUN: 12 mg/dL (ref 6–20)
Bilirubin Total: 0.6 mg/dL (ref 0.0–1.2)
CALCIUM: 9.5 mg/dL (ref 8.7–10.2)
CO2: 23 mmol/L (ref 18–29)
CREATININE: 0.9 mg/dL (ref 0.76–1.27)
Chloride, Ser: 105 mmol/L (ref 96–106)
GFR, EST AFRICAN AMERICAN: 130 mL/min/{1.73_m2} (ref >59)
GFR, EST NON AFRICAN AMERICAN: 113 mL/min/{1.73_m2} (ref >59)
GLUCOSE: 123 mg/dL — AB (ref 65–99)
Globulin, Total: 2.8 g/dL (ref 1.5–4.5)
Potassium, Ser: 3.9 mmol/L (ref 3.5–5.2)
Sodium: 136 mmol/L (ref 134–144)
Total Protein: 6.9 g/dL (ref 6.0–8.5)

## 2015-11-16 LAB — CBC WITH DIFFERENTIAL (CANCER CENTER ONLY)
BASO#: 0 10*3/uL (ref 0.0–0.2)
BASO%: 0.1 % (ref 0.0–2.0)
EOS ABS: 0.5 10*3/uL (ref 0.0–0.5)
EOS%: 7.4 % — AB (ref 0.0–7.0)
HEMATOCRIT: 50.2 % — AB (ref 38.7–49.9)
HEMOGLOBIN: 17.5 g/dL — AB (ref 13.0–17.1)
LYMPH#: 2.8 10*3/uL (ref 0.9–3.3)
LYMPH%: 38 % (ref 14.0–48.0)
MCH: 26.9 pg — ABNORMAL LOW (ref 28.0–33.4)
MCHC: 34.9 g/dL (ref 32.0–35.9)
MCV: 77 fL — ABNORMAL LOW (ref 82–98)
MONO#: 0.6 10*3/uL (ref 0.1–0.9)
MONO%: 8.4 % (ref 0.0–13.0)
NEUT%: 46.1 % (ref 40.0–80.0)
NEUTROS ABS: 3.4 10*3/uL (ref 1.5–6.5)
Platelets: 254 10*3/uL (ref 145–400)
RBC: 6.5 10*6/uL — ABNORMAL HIGH (ref 4.20–5.70)
RDW: 18.8 % — ABNORMAL HIGH (ref 11.1–15.7)
WBC: 7.3 10*3/uL (ref 4.0–10.0)

## 2015-11-16 NOTE — Patient Instructions (Signed)

## 2015-11-16 NOTE — Progress Notes (Signed)
Hematology and Oncology Follow Up Visit  Mary Gavrilov [id number] 1985/12/20 30 y.o. 11/16/2015   Principle Diagnosis:   Secondary polycythemia  Current Therapy:    Aspirin 81 mg by mouth daily  Phlebotomy to maintain hematocrit below 48%.     Interim History:  Mr. Jemerson is back for follow-up. He is feeling better. He was phlebotomized the last time we saw him. He felt better afterwards.  We last saw him in July, his ferritin was down to 12 his iron saturation was only 11%.  He is trying to work out more. He is not taking any Supplements to Augment His Testosterone.  He is still drinking energy drinks. I think he is still smoking but trying to stop.  He's had no bleeding. He's had no nausea or vomiting. He's had no chest pain. He's had no joint issues. He's had no headache.  He is still working without difficulties. 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  He comes in with his wife and son. They are a lot of fun to talk to.  Overall, his performance status is ECOG 0.  Medications:  Current Outpatient Prescriptions:  .  aspirin 325 MG tablet, Take 325 mg by mouth daily. Reported on 03/09/2015, Disp: , Rfl:   Allergies: No Known Allergies  Past Medical History, Surgical history, Social history, and Family History were reviewed and updated.  Review of Systems: As above  Physical Exam:  height is 5\' 9"  (1.753 m) and weight is 219 lb 6.4 oz (99.5 kg). His oral temperature is 97.6 F (36.4 C). His blood pressure is 143/86 (abnormal) and his pulse is 96. His respiration is 16.   Wt Readings from Last 3 Encounters:  11/16/15 219 lb 6.4 oz (99.5 kg)  08/17/15 214 lb (97.1 kg)  06/30/15 214 lb (97.1 kg)     Well-developed and well-nourished [place] gentleman in no obvious distress. Head and neck exam shows no ocular or oral lesions. There are no palpable cervical or supraclavicular lymph nodes. He has no scleral icterus. Lungs are clear.  Cardiac exam regular rate and rhythm with no murmurs, rubs or bruits. Abdomen is soft. He has good bowel sounds. There is no fluid wave. There is no palpable liver or spleen tip. Back exam shows no tenderness over the spine, ribs or hips. Extremities shows no clubbing, cyanosis or edema. Skin exam shows no plethora. He has no rashes. He has no ecchymoses or petechia. Neurological exam shows no focal neurological deficits.  Lab Results  Component Value Date   WBC 7.3 11/16/2015   HGB 17.5 (H) 11/16/2015   HCT 50.2 (H) 11/16/2015   MCV 77 (L) 11/16/2015   PLT 254 11/16/2015     Chemistry      Component Value Date/Time   NA 137 08/17/2015 1305   K 4.4 08/17/2015 1305   CL 105 04/28/2014 1211   CO2 21 (L) 08/17/2015 1305   BUN 16.2 08/17/2015 1305   CREATININE 1.0 08/17/2015 1305      Component Value Date/Time   CALCIUM 9.3 08/17/2015 1305   ALKPHOS 126 08/17/2015 1305   AST 25 08/17/2015 1305   ALT 40 08/17/2015 1305   BILITOT 0.56 08/17/2015 1305         Impression and Plan: Mr. Laskoski is a 30 year old [place] gentleman. He has secondary polycythemia. I do not believe he has primary polycythemia vera. I think that the only concern would be that his  erythropoietin level is not all that high.  He is cutting back on his smoking. He is working out more.  We will phlebotomize him today. Again, I want to get his hematocrit below 48%.   I will check his testosterone level was see him back. I will like to see him back in 6 weeks. I want to make sure that we get his blood count down before the holidays.    Volanda Napoleon, MD 10/11/20173:19 PM

## 2015-11-17 LAB — FERRITIN: Ferritin: 21 ng/ml — ABNORMAL LOW (ref 22–316)

## 2015-11-17 LAB — IRON AND TIBC
%SAT: 21 % (ref 20–55)
Iron: 73 ug/dL (ref 42–163)
TIBC: 355 ug/dL (ref 202–409)
UIBC: 282 ug/dL (ref 117–376)

## 2015-11-17 LAB — RETICULOCYTES: Reticulocyte Count: 1.2 % (ref 0.6–2.6)

## 2015-12-28 ENCOUNTER — Other Ambulatory Visit (HOSPITAL_BASED_OUTPATIENT_CLINIC_OR_DEPARTMENT_OTHER): Payer: No Typology Code available for payment source

## 2015-12-28 ENCOUNTER — Ambulatory Visit (HOSPITAL_BASED_OUTPATIENT_CLINIC_OR_DEPARTMENT_OTHER): Payer: No Typology Code available for payment source

## 2015-12-28 ENCOUNTER — Ambulatory Visit (HOSPITAL_BASED_OUTPATIENT_CLINIC_OR_DEPARTMENT_OTHER): Payer: No Typology Code available for payment source | Admitting: Family

## 2015-12-28 VITALS — BP 122/76 | HR 73 | Temp 98.3°F | Resp 16

## 2015-12-28 VITALS — BP 124/78 | HR 88

## 2015-12-28 DIAGNOSIS — D751 Secondary polycythemia: Secondary | ICD-10-CM

## 2015-12-28 DIAGNOSIS — R072 Precordial pain: Secondary | ICD-10-CM

## 2015-12-28 DIAGNOSIS — Z720 Tobacco use: Secondary | ICD-10-CM

## 2015-12-28 LAB — CBC WITH DIFFERENTIAL (CANCER CENTER ONLY)
BASO#: 0 10*3/uL (ref 0.0–0.2)
BASO%: 0.4 % (ref 0.0–2.0)
EOS ABS: 0.4 10*3/uL (ref 0.0–0.5)
EOS%: 4.7 % (ref 0.0–7.0)
HCT: 48.2 % (ref 38.7–49.9)
HGB: 16.8 g/dL (ref 13.0–17.1)
LYMPH#: 2.8 10*3/uL (ref 0.9–3.3)
LYMPH%: 34.7 % (ref 14.0–48.0)
MCH: 26.8 pg — AB (ref 28.0–33.4)
MCHC: 34.9 g/dL (ref 32.0–35.9)
MCV: 77 fL — ABNORMAL LOW (ref 82–98)
MONO#: 0.6 10*3/uL (ref 0.1–0.9)
MONO%: 7.3 % (ref 0.0–13.0)
NEUT%: 52.9 % (ref 40.0–80.0)
NEUTROS ABS: 4.3 10*3/uL (ref 1.5–6.5)
PLATELETS: 242 10*3/uL (ref 145–400)
RBC: 6.27 10*6/uL — AB (ref 4.20–5.70)
RDW: 16.6 % — ABNORMAL HIGH (ref 11.1–15.7)
WBC: 8.1 10*3/uL (ref 4.0–10.0)

## 2015-12-28 LAB — CHCC SATELLITE - SMEAR

## 2015-12-28 NOTE — Progress Notes (Signed)
Austin Poole presents today for phlebotomy per MD orders. Phlebotomy procedure started at 1315 and ended at 1325. 500 grams removed. Patient observed for 30 minutes after procedure without any incident. Patient tolerated procedure well. IV needle removed intact.

## 2015-12-28 NOTE — Progress Notes (Signed)
Hematology and Oncology Follow Up Visit  Austin Poole [id number] 08-Feb-1985 30 y.o. 12/28/2015   Principle Diagnosis:  Secondary polycythemia  Current Therapy:   Aspirin 81 mg by mouth daily Phlebotomy to maintain hematocrit below 48%    Interim History:  Austin Poole is here today for follow-up. Austin Poole is doing well and continue to cut back on smoking. Austin Poole is down to 1/2 ppd. Austin Poole Hct today is 48.2.  Austin Poole has no complaints at this time. Austin Poole is back in the gym working out when Austin Poole can. Austin Poole is looking forward to thanksgiving with Austin Poole family. No fever, chills, n/v, cough, rash, dizziness, headache, blurred vision, SOB, chest pain, palpitations, abdominal pain or changes in bowel or bladder habits.  No lymphadenopathy found on exam. No episodes of bleeding or bruising. No swelling, tenderness, numbness or tingling in her extremities. No c/o joint aches or pain.  Austin Poole has maintained a good appetite and is staying well hydrated. Austin Poole weight is stable.   Medications:    Medication List       Accurate as of 12/28/15  1:25 PM. Always use your most recent med list.          aspirin 325 MG tablet Take 325 mg by mouth daily. Reported on 03/09/2015       Allergies: No Known Allergies  Past Medical History, Surgical history, Social history, and Family History were reviewed and updated.  Review of Systems: All other 10 point review of systems is negative.   Physical Exam:  oral temperature is 98.3 F (36.8 C). Austin Poole blood pressure is 122/76 and Austin Poole pulse is 73. Austin Poole respiration is 16.   Wt Readings from Last 3 Encounters:  11/16/15 219 lb 6.4 oz (99.5 kg)  08/17/15 214 lb (97.1 kg)  06/30/15 214 lb (97.1 kg)    Ocular: Sclerae unicteric, pupils equal, round and reactive to light Ear-nose-throat: Oropharynx clear, dentition fair Lymphatic: No cervical supraclavicular or axillary adenopathy Lungs no rales or rhonchi, good excursion bilaterally Heart regular rate and rhythm, no murmur  appreciated Abd soft, nontender, positive bowel sounds, no liver or spleen tip palpated on exam, no fluid wave MSK no focal spinal tenderness, no joint edema Neuro: non-focal, well-oriented, appropriate affect Breasts: Deferred  Lab Results  Component Value Date   WBC 8.1 12/28/2015   HGB 16.8 12/28/2015   HCT 48.2 12/28/2015   MCV 77 (L) 12/28/2015   PLT 242 12/28/2015   Lab Results  Component Value Date   FERRITIN 21 (L) 11/16/2015   IRON 73 11/16/2015   TIBC 355 11/16/2015   UIBC 282 11/16/2015   IRONPCTSAT 21 11/16/2015   Lab Results  Component Value Date   RETICCTPCT 1.7 04/28/2014   RBC 6.27 (H) 12/28/2015   RETICCTABS 103.2 04/28/2014   No results found for: KPAFRELGTCHN, LAMBDASER, KAPLAMBRATIO No results found for: IGGSERUM, IGA, IGMSERUM No results found for: [name], A1GS, A2GS, Tillman Sers, SPEI   Chemistry      Component Value Date/Time   NA 136 11/16/2015 1432   NA 137 08/17/2015 1305   K 3.9 11/16/2015 1432   K 4.4 08/17/2015 1305   CL 105 11/16/2015 1432   CL 105 04/28/2014 1211   CO2 23 11/16/2015 1432   CO2 21 (L) 08/17/2015 1305   BUN 12 11/16/2015 1432   BUN 16.2 08/17/2015 1305   CREATININE 0.90 11/16/2015 1432   CREATININE 1.0 08/17/2015 1305      Component Value Date/Time   CALCIUM 9.5 11/16/2015  1432   CALCIUM 9.3 08/17/2015 1305   ALKPHOS 121 (H) 11/16/2015 1432   ALKPHOS 126 08/17/2015 1305   AST 77 (H) 11/16/2015 1432   AST 25 08/17/2015 1305   ALT 89 (H) 11/16/2015 1432   ALT 40 08/17/2015 1305   BILITOT 0.6 11/16/2015 1432   BILITOT 0.56 08/17/2015 1305     Impression and Plan: Austin Poole is a 30 yo [place] gentleman with secondary polycythemia due to smoking. Austin Poole is still trying to quit and is now down to 1/2 ppd. Austin Poole has no complaints at this time.  Austin Poole Hct is 48.2 so we will phlebotomize Austin Poole today.  We will plan to see Austin Poole back in 2 months for repeat lab work and follow-up.  Austin Poole will contact our  office with any questions or concerns. We can certainly see Austin Poole sooner if need be.   Eliezer Bottom, NP 11/22/20171:25 PM

## 2015-12-28 NOTE — Patient Instructions (Signed)
phTherapeutic Phlebotomy Therapeutic phlebotomy is the controlled removal of blood from a person's body for the purpose of treating a medical condition. The procedure is similar to donating blood. Usually, about a pint (470 mL, or 0.47L) of blood is removed. The average adult has 9-12 pints (4.3-5.7 L) of blood. Therapeutic phlebotomy may be used to treat the following medical conditions:  Hemochromatosis. This is a condition in which the blood contains too much iron.  Polycythemia vera. This is a condition in which the blood contains too many red blood cells.  Porphyria cutanea tarda. This is a disease in which an important part of hemoglobin is not made properly. It results in the buildup of abnormal amounts of porphyrins in the body.  Sickle cell disease. This is a condition in which the red blood cells form an abnormal crescent shape rather than a round shape. Tell a health care provider about:  Any allergies you have.  All medicines you are taking, including vitamins, herbs, eye drops, creams, and over-the-counter medicines.  Any problems you or family members have had with anesthetic medicines.  Any blood disorders you have.  Any surgeries you have had.  Any medical conditions you have. What are the risks? Generally, this is a safe procedure. However, problems may occur, including:  Nausea or light-headedness.  Low blood pressure.  Soreness, bleeding, swelling, or bruising at the needle insertion site.  Infection. What happens before the procedure?  Follow instructions from your health care provider about eating or drinking restrictions.  Ask your health care provider about changing or stopping your regular medicines. This is especially important if you are taking diabetes medicines or blood thinners.  Wear clothing with sleeves that can be raised above the elbow.  Plan to have someone take you home after the procedure.  You may have a blood sample taken. What  happens during the procedure?  A needle will be inserted into one of your veins.  Tubing and a collection bag will be attached to that needle.  Blood will flow through the needle and tubing into the collection bag.  You may be asked to open and close your hand slowly and continually during the entire collection.  After the specified amount of blood has been removed from your body, the collection bag and tubing will be clamped.  The needle will be removed from your vein.  Pressure will be held on the site of the needle insertion to stop the bleeding.  A bandage (dressing) will be placed over the needle insertion site. The procedure may vary among health care providers and hospitals. What happens after the procedure?  Your recovery will be assessed and monitored.  You can return to your normal activities as directed by your health care provider. This information is not intended to replace advice given to you by your health care provider. Make sure you discuss any questions you have with your health care provider. Document Released: 06/26/2010 Document Revised: 09/24/2015 Document Reviewed: 01/18/2014 Elsevier Interactive Patient Education  2017 [employer].

## 2015-12-29 LAB — TESTOSTERONE: Testosterone, Serum: 327 ng/dL (ref 264–916)

## 2015-12-30 LAB — FERRITIN: FERRITIN: 9 ng/mL — AB (ref 22–316)

## 2015-12-30 LAB — IRON AND TIBC
%SAT: 24 % (ref 20–55)
IRON: 94 ug/dL (ref 42–163)
TIBC: 389 ug/dL (ref 202–409)
UIBC: 294 ug/dL (ref 117–376)

## 2016-02-08 ENCOUNTER — Ambulatory Visit (HOSPITAL_BASED_OUTPATIENT_CLINIC_OR_DEPARTMENT_OTHER): Payer: No Typology Code available for payment source

## 2016-02-08 ENCOUNTER — Ambulatory Visit (HOSPITAL_BASED_OUTPATIENT_CLINIC_OR_DEPARTMENT_OTHER): Payer: No Typology Code available for payment source | Admitting: Hematology & Oncology

## 2016-02-08 ENCOUNTER — Other Ambulatory Visit (HOSPITAL_BASED_OUTPATIENT_CLINIC_OR_DEPARTMENT_OTHER): Payer: No Typology Code available for payment source

## 2016-02-08 ENCOUNTER — Encounter: Payer: Self-pay | Admitting: Hematology & Oncology

## 2016-02-08 VITALS — BP 134/83 | HR 89 | Temp 98.3°F | Resp 20 | Wt 226.0 lb

## 2016-02-08 VITALS — BP 132/78 | HR 75 | Resp 18

## 2016-02-08 DIAGNOSIS — R072 Precordial pain: Secondary | ICD-10-CM

## 2016-02-08 DIAGNOSIS — D571 Sickle-cell disease without crisis: Secondary | ICD-10-CM

## 2016-02-08 DIAGNOSIS — D751 Secondary polycythemia: Secondary | ICD-10-CM

## 2016-02-08 LAB — CBC WITH DIFFERENTIAL (CANCER CENTER ONLY)
BASO#: 0 10*3/uL (ref 0.0–0.2)
BASO%: 0.3 % (ref 0.0–2.0)
EOS ABS: 0.3 10*3/uL (ref 0.0–0.5)
EOS%: 3.6 % (ref 0.0–7.0)
HEMATOCRIT: 48.2 % (ref 38.7–49.9)
HEMOGLOBIN: 16.4 g/dL (ref 13.0–17.1)
LYMPH#: 3 10*3/uL (ref 0.9–3.3)
LYMPH%: 33.7 % (ref 14.0–48.0)
MCH: 26.7 pg — AB (ref 28.0–33.4)
MCHC: 34 g/dL (ref 32.0–35.9)
MCV: 78 fL — ABNORMAL LOW (ref 82–98)
MONO#: 0.7 10*3/uL (ref 0.1–0.9)
MONO%: 7.8 % (ref 0.0–13.0)
NEUT%: 54.6 % (ref 40.0–80.0)
NEUTROS ABS: 4.8 10*3/uL (ref 1.5–6.5)
Platelets: 242 10*3/uL (ref 145–400)
RBC: 6.15 10*6/uL — AB (ref 4.20–5.70)
RDW: 15.8 % — ABNORMAL HIGH (ref 11.1–15.7)
WBC: 8.8 10*3/uL (ref 4.0–10.0)

## 2016-02-08 LAB — IRON AND TIBC
%SAT: 18 % — ABNORMAL LOW (ref 20–55)
IRON: 71 ug/dL (ref 42–163)
TIBC: 401 ug/dL (ref 202–409)
UIBC: 330 ug/dL (ref 117–376)

## 2016-02-08 LAB — FERRITIN: Ferritin: 15 ng/ml — ABNORMAL LOW (ref 22–316)

## 2016-02-08 LAB — CHCC SATELLITE - SMEAR

## 2016-02-08 NOTE — Progress Notes (Signed)
Austin Poole presents today for phlebotomy per MD orders. Phlebotomy procedure started at1305 and ended at 1320. 500 grams removed using 16g phlebotomy kit.  Patient observed for 30 minutes after procedure without any incident. Patient tolerated procedure well. IV needle removed intact.

## 2016-02-08 NOTE — Progress Notes (Signed)
Hematology and Oncology Follow Up Visit  Marvelle Collinsworth [id number] Nov 06, 1985 31 y.o. 02/08/2016   Principle Diagnosis:   Secondary polycythemia  Current Therapy:    Aspirin 81 mg by mouth daily  Phlebotomy to maintain hematocrit below 48%.     Interim History:  Mr. Hubley is back for follow-up. He is feeling better. He was phlebotomized the last time we saw him. He felt better afterwards.  We last saw him in November. At that time, he was clearly iron deficient with a ferritin of only 9. His iron saturation was 24%.  With the holidays, is not worked out as much. He's gained weight.  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 Ultimately, he will get his license for this and start driving a truck. 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 This may have an effect on his blood count.  He has had no fever. He has had no cough. He has had no rashes. He has had no change in bowel or bladder habits.  He is taking aspirin. He is doing well on the aspirin. He has had no bleeding.  Overall, his performance status is ECOG 0.  Medications:  Current Outpatient Prescriptions:  .  aspirin 325 MG tablet, Take 325 mg by mouth daily. Reported on 03/09/2015, Disp: , Rfl:   Allergies: No Known Allergies  Past Medical History, Surgical history, Social history, and Family History were reviewed and updated.  Review of Systems: As above  Physical Exam:  weight is 226 lb (102.5 kg). His oral temperature is 98.3 F (36.8 C). His blood pressure is 134/83 and his pulse is 89. His respiration is 20.   Wt Readings from Last 3 Encounters:  02/08/16 226 lb (102.5 kg)  11/16/15 219 lb 6.4 oz (99.5 kg)  08/17/15 214 lb (97.1 kg)     Well-developed and well-nourished [place] gentleman in no obvious distress. Head and neck exam shows no ocular or oral lesions. There are no palpable cervical or supraclavicular lymph nodes. He has no  scleral icterus. Lungs are clear. Cardiac exam regular rate and rhythm with no murmurs, rubs or bruits. Abdomen is soft. He has good bowel sounds. There is no fluid wave. There is no palpable liver or spleen tip. Back exam shows no tenderness over the spine, ribs or hips. Extremities shows no clubbing, cyanosis or edema. Skin exam shows no plethora. He has no rashes. He has no ecchymoses or petechia. Neurological exam shows no focal neurological deficits.  Lab Results  Component Value Date   WBC 8.8 02/08/2016   HGB 16.4 02/08/2016   HCT 48.2 02/08/2016   MCV 78 (L) 02/08/2016   PLT 242 02/08/2016     Chemistry      Component Value Date/Time   NA 136 11/16/2015 1432   NA 137 08/17/2015 1305   K 3.9 11/16/2015 1432   K 4.4 08/17/2015 1305   CL 105 11/16/2015 1432   CL 105 04/28/2014 1211   CO2 23 11/16/2015 1432   CO2 21 (L) 08/17/2015 1305   BUN 12 11/16/2015 1432   BUN 16.2 08/17/2015 1305   CREATININE 0.90 11/16/2015 1432   CREATININE 1.0 08/17/2015 1305      Component Value Date/Time   CALCIUM 9.5 11/16/2015 1432   CALCIUM 9.3 08/17/2015 1305   ALKPHOS 121 (H) 11/16/2015 1432   ALKPHOS 126 08/17/2015 1305   AST 77 (H) 11/16/2015  1432   AST 25 08/17/2015 1305   ALT 89 (H) 11/16/2015 1432   ALT 40 08/17/2015 1305   BILITOT 0.6 11/16/2015 1432   BILITOT 0.56 08/17/2015 1305         Impression and Plan: Mr. Desta is a 31 year old [place] gentleman. He has secondary polycythemia. I do not believe he has primary polycythemia vera. I think that the only concern would be that his erythropoietin level is not all that high.  He is cutting back on his smoking. He is working out more. 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  We will go ahead and phlebotomize him. I really want to get his hematocrit below 48%.  We will see him back in 2 months.  Volanda Napoleon, MD 1/3/20181:06 PM

## 2016-02-08 NOTE — Patient Instructions (Signed)
Therapeutic Phlebotomy Therapeutic phlebotomy is the controlled removal of blood from a person's body for the purpose of treating a medical condition. The procedure is similar to donating blood. Usually, about a pint (470 mL, or 0.47L) of blood is removed. The average adult has 9-12 pints (4.3-5.7 L) of blood. Therapeutic phlebotomy may be used to treat the following medical conditions:  Hemochromatosis. This is a condition in which the blood contains too much iron.  Polycythemia vera. This is a condition in which the blood contains too many red blood cells.  Porphyria cutanea tarda. This is a disease in which an important part of hemoglobin is not made properly. It results in the buildup of abnormal amounts of porphyrins in the body.  Sickle cell disease. This is a condition in which the red blood cells form an abnormal crescent shape rather than a round shape. Tell a health care provider about:  Any allergies you have.  All medicines you are taking, including vitamins, herbs, eye drops, creams, and over-the-counter medicines.  Any problems you or family members have had with anesthetic medicines.  Any blood disorders you have.  Any surgeries you have had.  Any medical conditions you have. What are the risks? Generally, this is a safe procedure. However, problems may occur, including:  Nausea or light-headedness.  Low blood pressure.  Soreness, bleeding, swelling, or bruising at the needle insertion site.  Infection. What happens before the procedure?  Follow instructions from your health care provider about eating or drinking restrictions.  Ask your health care provider about changing or stopping your regular medicines. This is especially important if you are taking diabetes medicines or blood thinners.  Wear clothing with sleeves that can be raised above the elbow.  Plan to have someone take you home after the procedure.  You may have a blood sample taken. What happens  during the procedure?  A needle will be inserted into one of your veins.  Tubing and a collection bag will be attached to that needle.  Blood will flow through the needle and tubing into the collection bag.  You may be asked to open and close your hand slowly and continually during the entire collection.  After the specified amount of blood has been removed from your body, the collection bag and tubing will be clamped.  The needle will be removed from your vein.  Pressure will be held on the site of the needle insertion to stop the bleeding.  A bandage (dressing) will be placed over the needle insertion site. The procedure may vary among health care providers and hospitals. What happens after the procedure?  Your recovery will be assessed and monitored.  You can return to your normal activities as directed by your health care provider. This information is not intended to replace advice given to you by your health care provider. Make sure you discuss any questions you have with your health care provider. Document Released: 06/26/2010 Document Revised: 09/24/2015 Document Reviewed: 01/18/2014 Elsevier Interactive Patient Education  2017 Elsevier Inc.  

## 2016-04-04 IMAGING — CT CT ABD-PELV W/ CM
2 of 7 series · 14 of 46 positions shown, 18 images · IV contrast (APPLIED)
Comparison: None.

CLINICAL DATA: Abnormal liver function tests. Erythrocytosis.
Polycythemia.

EXAM:
CT ABDOMEN AND PELVIS WITH CONTRAST
TECHNIQUE: Multidetector CT imaging of the abdomen and pelvis was performed
using the standard protocol following bolus administration of
intravenous contrast.
CONTRAST:  100mL OMNIPAQUE IOHEXOL 300 MG/ML  SOLN

[Series 2: abd/pelvis 5.0 b31f · axial · 0.72mm/px · z∈[+776,+1211]mm · 11 of 101 slices shown, 15 images]
[im 9/101  soft-tissue]
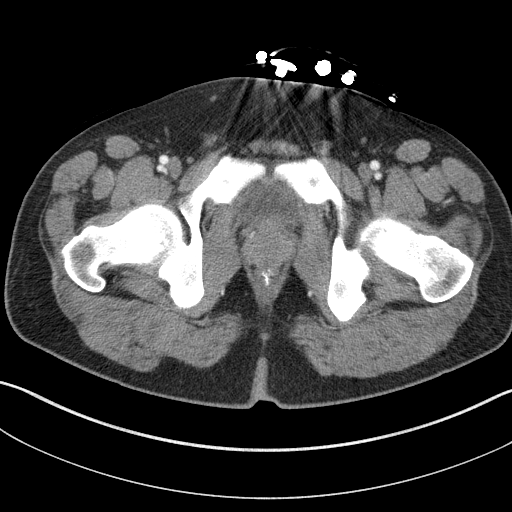
[im 9/101  bone]
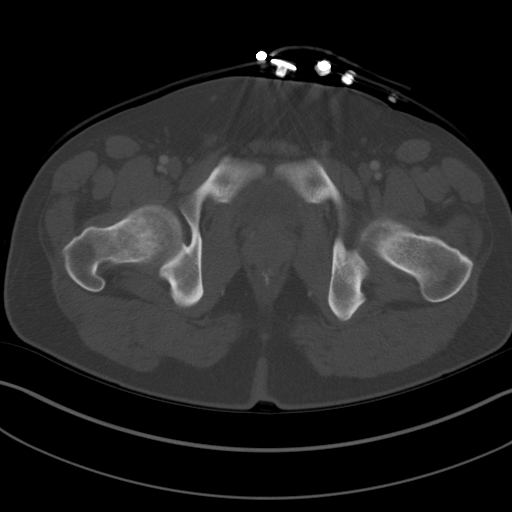
[im 18/101  soft-tissue]
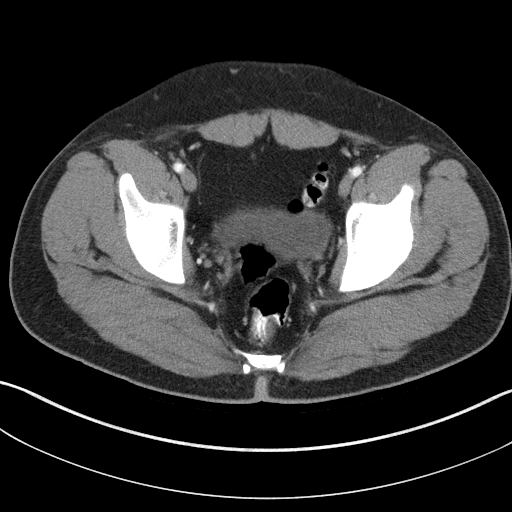
[im 31/101  soft-tissue]
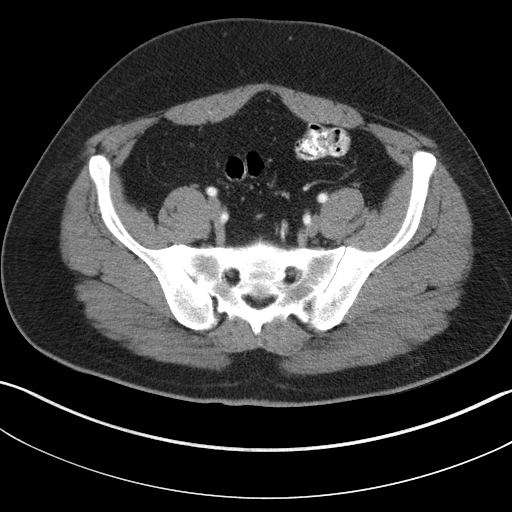
[im 40/101  soft-tissue]
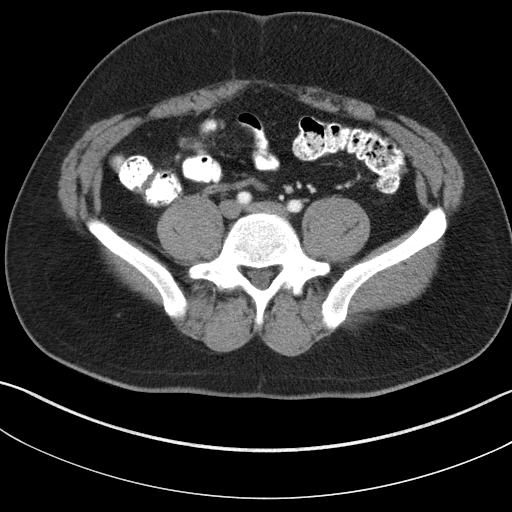
[im 53/101  soft-tissue]
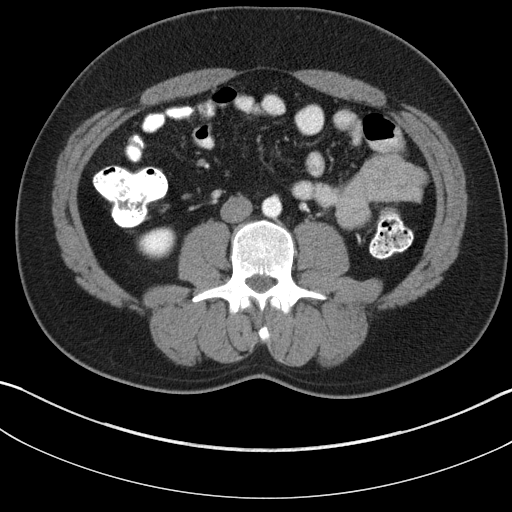
[im 61/101  soft-tissue]
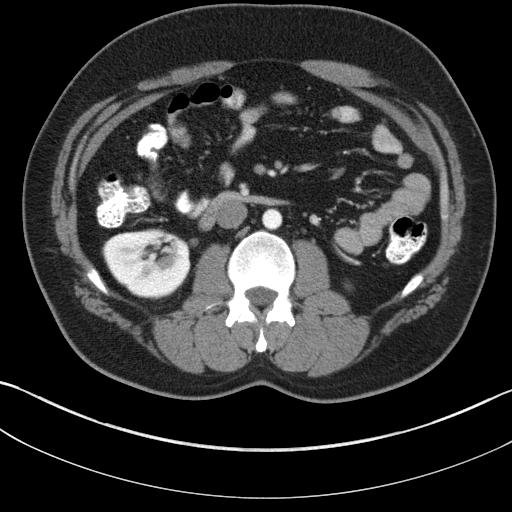
[im 70/101  soft-tissue]
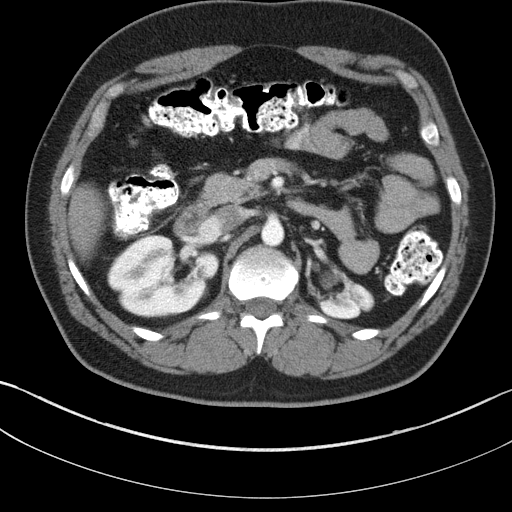
[im 83/101  soft-tissue]
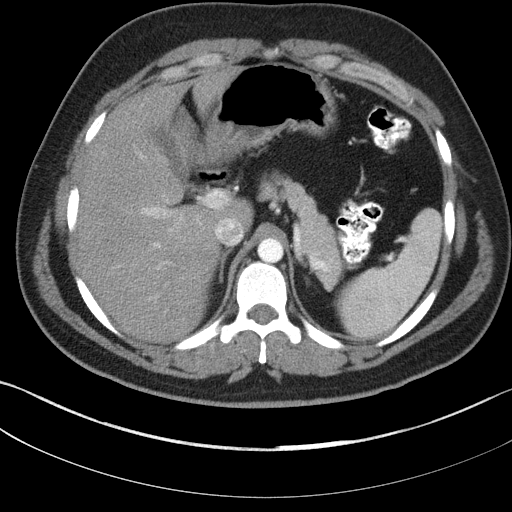
[im 83/101  lung]
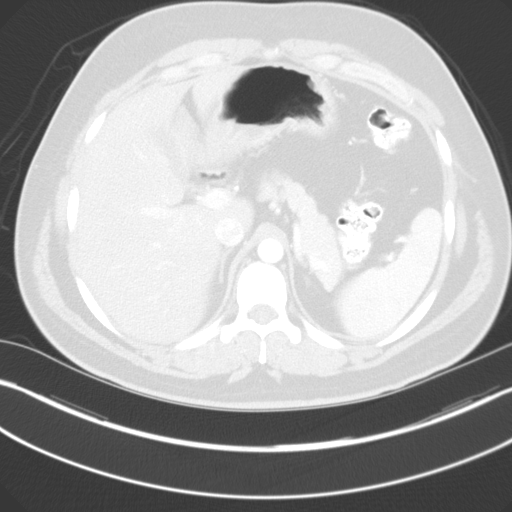
[im 87/101  lung]
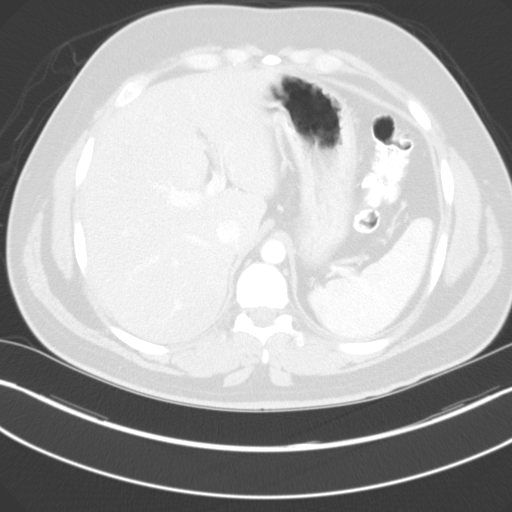
[im 92/101  soft-tissue]
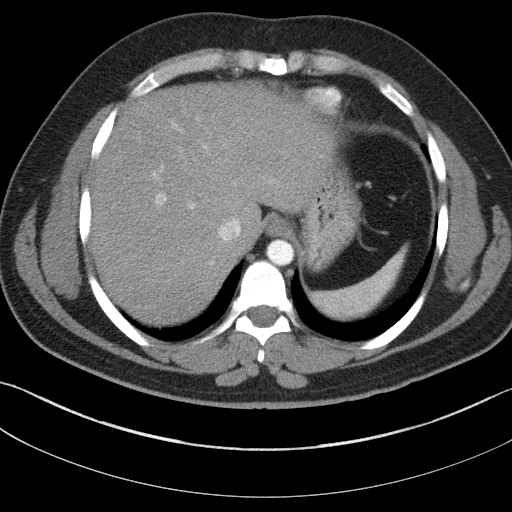
[im 92/101  lung]
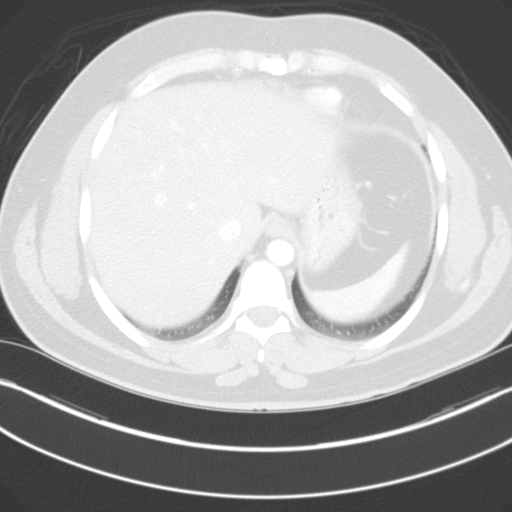
[im 92/101  bone]
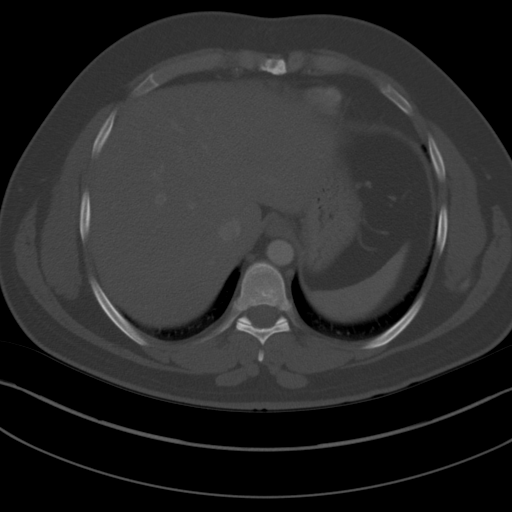
[im 96/101  lung]
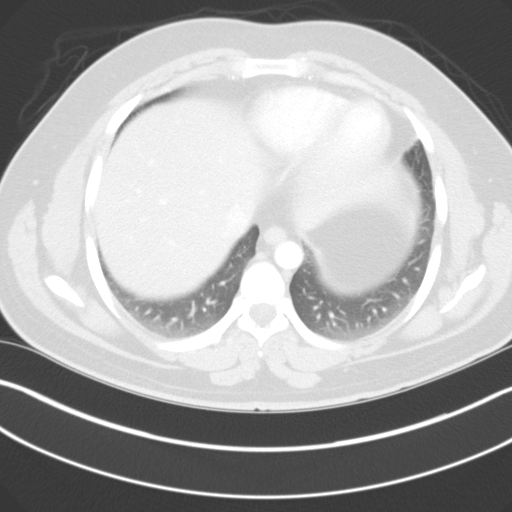

[Series 6: abd/pelvis 3.0 coronal · coronal · 0.75mm/px · 3 of 96 slices shown]
[im 24/96  soft-tissue]
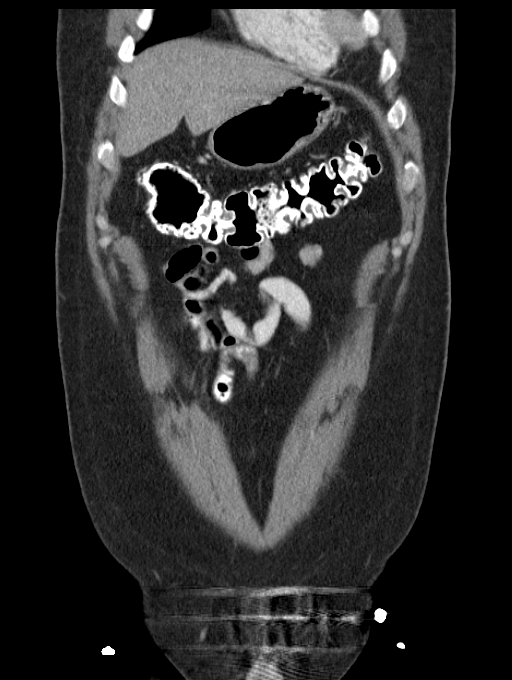
[im 48/96  soft-tissue]
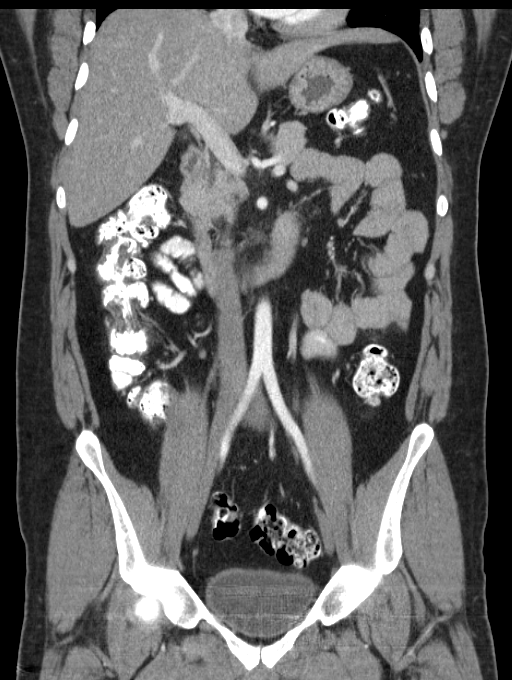
[im 72/96  soft-tissue]
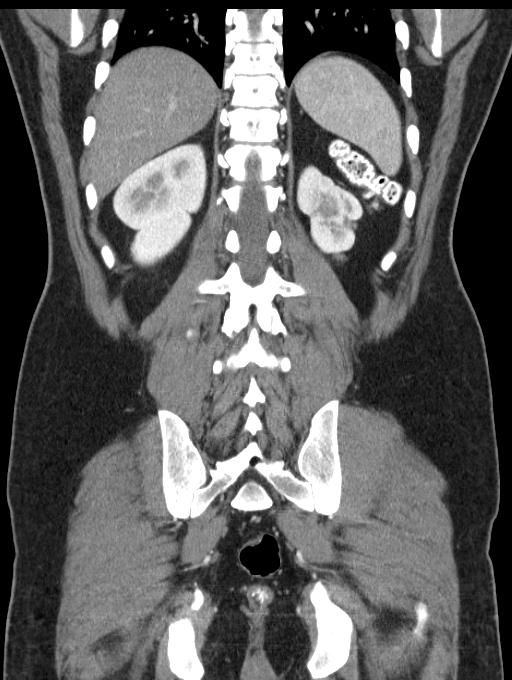

[14 of 46 positions shown; findings below may reference images not displayed]

FINDINGS: Lower chest:  Unremarkable

Hepatobiliary: Diffuse hepatic steatosis. No hepatic mass
identified. Minimally contracted gallbladder.

Pancreas: Unremarkable

Spleen: Unremarkable

Adrenals/Urinary Tract: Diminutive and lobular left kidney with
compensatory hypertrophy of the right. Appearance probably reflects
multifocal chronic left renal scarring.

Stomach/Bowel: Unremarkable

Vascular/Lymphatic: Unremarkable

Reproductive: Unremarkable

Other: No supplemental non-categorized findings.

Musculoskeletal: Left foraminal disc osteophyte complex at L5-S1
causing mild left foraminal stenosis and borderline left
subarticular lateral recess stenosis.
IMPRESSION: 1. Diffuse hepatic steatosis.
2. Extensive scarring of the left kidney, leading a small and
irregular residual kidney and with compensatory hypertrophy of the
right kidney.
3. No adenopathy.
4. Left foraminal disc osteophyte complex at L5-S1 causing mild
impingement.

## 2016-04-05 ENCOUNTER — Other Ambulatory Visit (HOSPITAL_BASED_OUTPATIENT_CLINIC_OR_DEPARTMENT_OTHER): Payer: No Typology Code available for payment source

## 2016-04-05 ENCOUNTER — Ambulatory Visit: Payer: No Typology Code available for payment source

## 2016-04-05 ENCOUNTER — Ambulatory Visit (HOSPITAL_BASED_OUTPATIENT_CLINIC_OR_DEPARTMENT_OTHER): Payer: No Typology Code available for payment source | Admitting: Family

## 2016-04-05 VITALS — BP 130/77 | HR 72 | Temp 98.1°F | Resp 16 | Wt 220.0 lb

## 2016-04-05 DIAGNOSIS — Z720 Tobacco use: Secondary | ICD-10-CM

## 2016-04-05 DIAGNOSIS — D751 Secondary polycythemia: Secondary | ICD-10-CM

## 2016-04-05 LAB — CBC WITH DIFFERENTIAL (CANCER CENTER ONLY)
BASO#: 0 10*3/uL (ref 0.0–0.2)
BASO%: 0.5 % (ref 0.0–2.0)
EOS%: 4.9 % (ref 0.0–7.0)
Eosinophils Absolute: 0.4 10*3/uL (ref 0.0–0.5)
HEMATOCRIT: 48.8 % (ref 38.7–49.9)
HGB: 16.7 g/dL (ref 13.0–17.1)
LYMPH#: 3.1 10*3/uL (ref 0.9–3.3)
LYMPH%: 36.6 % (ref 14.0–48.0)
MCH: 26.8 pg — ABNORMAL LOW (ref 28.0–33.4)
MCHC: 34.2 g/dL (ref 32.0–35.9)
MCV: 78 fL — AB (ref 82–98)
MONO#: 0.8 10*3/uL (ref 0.1–0.9)
MONO%: 9.2 % (ref 0.0–13.0)
NEUT#: 4.2 10*3/uL (ref 1.5–6.5)
NEUT%: 48.8 % (ref 40.0–80.0)
PLATELETS: 216 10*3/uL (ref 145–400)
RBC: 6.24 10*6/uL — AB (ref 4.20–5.70)
RDW: 16.3 % — AB (ref 11.1–15.7)
WBC: 8.5 10*3/uL (ref 4.0–10.0)

## 2016-04-05 NOTE — Progress Notes (Signed)
Hematology and Oncology Follow Up Visit  Samik Bapst [id number] 1985-07-11 31 y.o. 04/05/2016   Principle Diagnosis:  Secondary polycythemia  Current Therapy:   Aspirin 81 mg by mouth daily Phlebotomy to maintain hematocrit below 48%    Interim History:  Mr. Curb is here today for follow-up. He is doing well and has no complaints at this time. He is still smoking 1/2 ppd abut states that he is trying to quit.  Hct is up a bit at 48.8. We will proceed with a phlebotomy today.  He stopped taking his aspirin for a couple week after having his wisdom teeth out. He is restarting this today.  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  Unfortunately he recently found out that his father's cancer recurred and he is starting chemo treatment again. He has been helping both of his parents quite a bit.  He has had no issue with infections. No fever, chills, n/v, cough, rash, dizziness, headache, blurred vision, SOB, chest pain, palpitations, abdominal pain or changes in bowel or bladder habits.  No lymphadenopathy found on exam. No episodes of bleeding or bruising. No swelling, tenderness, numbness or tingling in her extremities. No c/o joint aches or pain.  He has maintained a good appetite and is staying well hydrated. His weight is stable.   Medications:  Allergies as of 04/05/2016   No Known Allergies     Medication List       Accurate as of 04/05/16 11:49 AM. Always use your most recent med list.          aspirin 325 MG tablet Take 325 mg by mouth daily. Reported on 03/09/2015       Allergies: No Known Allergies  Past Medical History, Surgical history, Social history, and Family History were reviewed and updated.  Review of Systems: All other 10 point review of systems is negative.   Physical Exam:  vitals were not taken for this visit.  Wt Readings from Last 3  Encounters:  02/08/16 226 lb (102.5 kg)  11/16/15 219 lb 6.4 oz (99.5 kg)  08/17/15 214 lb (97.1 kg)    Ocular: Sclerae unicteric, pupils equal, round and reactive to light Ear-nose-throat: Oropharynx clear, dentition fair Lymphatic: No cervical supraclavicular or axillary adenopathy Lungs no rales or rhonchi, good excursion bilaterally Heart regular rate and rhythm, no murmur appreciated Abd soft, nontender, positive bowel sounds, no liver or spleen tip palpated on exam, no fluid wave MSK no focal spinal tenderness, no joint edema Neuro: non-focal, well-oriented, appropriate affect Breasts: Deferred  Lab Results  Component Value Date   WBC 8.5 04/05/2016   HGB 16.7 04/05/2016   HCT 48.8 04/05/2016   MCV 78 (L) 04/05/2016   PLT 216 04/05/2016   Lab Results  Component Value Date   FERRITIN 15 (L) 02/08/2016   IRON 71 02/08/2016   TIBC 401 02/08/2016   UIBC 330 02/08/2016   IRONPCTSAT 18 (L) 02/08/2016   Lab Results  Component Value Date   RETICCTPCT 1.7 04/28/2014   RBC 6.24 (H) 04/05/2016   RETICCTABS 103.2 04/28/2014   No results found for: KPAFRELGTCHN, LAMBDASER, KAPLAMBRATIO No results found for: IGGSERUM, IGA, IGMSERUM No results found for: Odetta Pink, SPEI   Chemistry      Component Value Date/Time   NA 136 11/16/2015 1432   NA 137 08/17/2015 1305  K 3.9 11/16/2015 1432   K 4.4 08/17/2015 1305   CL 105 11/16/2015 1432   CL 105 04/28/2014 1211   CO2 23 11/16/2015 1432   CO2 21 (L) 08/17/2015 1305   BUN 12 11/16/2015 1432   BUN 16.2 08/17/2015 1305   CREATININE 0.90 11/16/2015 1432   CREATININE 1.0 08/17/2015 1305      Component Value Date/Time   CALCIUM 9.5 11/16/2015 1432   CALCIUM 9.3 08/17/2015 1305   ALKPHOS 121 (H) 11/16/2015 1432   ALKPHOS 126 08/17/2015 1305   AST 77 (H) 11/16/2015 1432   AST 25 08/17/2015 1305   ALT 89 (H) 11/16/2015 1432   ALT 40 08/17/2015 1305   BILITOT 0.6  11/16/2015 1432   BILITOT 0.56 08/17/2015 1305     Impression and Plan: Mr. 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 He continues to smoke 1/2 ppd and states that he is trying to quit. He is asymptomatic at this time and has no complaints.  Hct is 48.8 so we will proceed with phlebotomy today.  We will plan to see him back again in 2 months for repeat lab work and follow-up.  He will contact our office with any questions or concerns. We can certainly see him sooner if need be.   Eliezer Bottom, NP 3/1/201811:49 AM

## 2016-04-05 NOTE — Patient Instructions (Signed)
Therapeutic Phlebotomy Therapeutic phlebotomy is the controlled removal of blood from a person's body for the purpose of treating a medical condition. The procedure is similar to donating blood. Usually, about a pint (470 mL, or 0.47L) of blood is removed. The average adult has 9-12 pints (4.3-5.7 L) of blood. Therapeutic phlebotomy may be used to treat the following medical conditions:  Hemochromatosis. This is a condition in which the blood contains too much iron.  Polycythemia vera. This is a condition in which the blood contains too many red blood cells.  Porphyria cutanea tarda. This is a disease in which an important part of hemoglobin is not made properly. It results in the buildup of abnormal amounts of porphyrins in the body.  Sickle cell disease. This is a condition in which the red blood cells form an abnormal crescent shape rather than a round shape. Tell a health care provider about:  Any allergies you have.  All medicines you are taking, including vitamins, herbs, eye drops, creams, and over-the-counter medicines.  Any problems you or family members have had with anesthetic medicines.  Any blood disorders you have.  Any surgeries you have had.  Any medical conditions you have. What are the risks? Generally, this is a safe procedure. However, problems may occur, including:  Nausea or light-headedness.  Low blood pressure.  Soreness, bleeding, swelling, or bruising at the needle insertion site.  Infection. What happens before the procedure?  Follow instructions from your health care provider about eating or drinking restrictions.  Ask your health care provider about changing or stopping your regular medicines. This is especially important if you are taking diabetes medicines or blood thinners.  Wear clothing with sleeves that can be raised above the elbow.  Plan to have someone take you home after the procedure.  You may have a blood sample taken. What happens  during the procedure?  A needle will be inserted into one of your veins.  Tubing and a collection bag will be attached to that needle.  Blood will flow through the needle and tubing into the collection bag.  You may be asked to open and close your hand slowly and continually during the entire collection.  After the specified amount of blood has been removed from your body, the collection bag and tubing will be clamped.  The needle will be removed from your vein.  Pressure will be held on the site of the needle insertion to stop the bleeding.  A bandage (dressing) will be placed over the needle insertion site. The procedure may vary among health care providers and hospitals. What happens after the procedure?  Your recovery will be assessed and monitored.  You can return to your normal activities as directed by your health care provider. This information is not intended to replace advice given to you by your health care provider. Make sure you discuss any questions you have with your health care provider. Document Released: 06/26/2010 Document Revised: 09/24/2015 Document Reviewed: 01/18/2014 Elsevier Interactive Patient Education  2017 Elsevier Inc.  

## 2016-06-07 ENCOUNTER — Ambulatory Visit (HOSPITAL_BASED_OUTPATIENT_CLINIC_OR_DEPARTMENT_OTHER): Payer: No Typology Code available for payment source

## 2016-06-07 ENCOUNTER — Other Ambulatory Visit (HOSPITAL_BASED_OUTPATIENT_CLINIC_OR_DEPARTMENT_OTHER): Payer: No Typology Code available for payment source

## 2016-06-07 ENCOUNTER — Ambulatory Visit (HOSPITAL_BASED_OUTPATIENT_CLINIC_OR_DEPARTMENT_OTHER): Payer: No Typology Code available for payment source | Admitting: Hematology & Oncology

## 2016-06-07 VITALS — BP 139/85 | HR 78 | Temp 97.9°F | Resp 16 | Wt 223.1 lb

## 2016-06-07 DIAGNOSIS — D751 Secondary polycythemia: Secondary | ICD-10-CM

## 2016-06-07 LAB — CBC WITH DIFFERENTIAL (CANCER CENTER ONLY)
BASO#: 0 10*3/uL (ref 0.0–0.2)
BASO%: 0.4 % (ref 0.0–2.0)
EOS%: 7.8 % — AB (ref 0.0–7.0)
Eosinophils Absolute: 0.6 10*3/uL — ABNORMAL HIGH (ref 0.0–0.5)
HCT: 49.5 % (ref 38.7–49.9)
HGB: 17 g/dL (ref 13.0–17.1)
LYMPH#: 2.4 10*3/uL (ref 0.9–3.3)
LYMPH%: 32.6 % (ref 14.0–48.0)
MCH: 26.5 pg — ABNORMAL LOW (ref 28.0–33.4)
MCHC: 34.3 g/dL (ref 32.0–35.9)
MCV: 77 fL — ABNORMAL LOW (ref 82–98)
MONO#: 0.5 10*3/uL (ref 0.1–0.9)
MONO%: 6.3 % (ref 0.0–13.0)
NEUT#: 3.9 10*3/uL (ref 1.5–6.5)
NEUT%: 52.9 % (ref 40.0–80.0)
PLATELETS: 240 10*3/uL (ref 145–400)
RBC: 6.41 10*6/uL — AB (ref 4.20–5.70)
RDW: 17 % — ABNORMAL HIGH (ref 11.1–15.7)
WBC: 7.5 10*3/uL (ref 4.0–10.0)

## 2016-06-07 NOTE — Progress Notes (Signed)
Hematology and Oncology Follow Up Visit  Maguire Sime [id number] 16-Nov-1985 31 y.o. 06/07/2016   Principle Diagnosis:   Secondary polycythemia  Current Therapy:    Aspirin 81 mg by mouth daily  Phlebotomy to maintain hematocrit below 48%.     Interim History:  Mr. Boomhower is back for follow-up. He is doing okay. 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 He said that this was just too long being away from his family. As such, he is now back in town. Apparently, his family will open up a second store in [place].  We are doing a good job with his iron. Back in January, his iron levels and a ferritin of 15 with iron saturation of 18%.  He's done a good job can back on cigarettes. He is really not smoking much at all.  He is still working out.  He's had no rashes. He's had no fever. He's had no bleeding.   Overall, his performance status is ECOG 0.  Medications:  Current Outpatient Prescriptions:  .  aspirin 325 MG tablet, Take 325 mg by mouth daily. Reported on 03/09/2015, Disp: , Rfl:   Allergies: No Known Allergies  Past Medical History, Surgical history, Social history, and Family History were reviewed and updated.  Review of Systems: As above  Physical Exam:  weight is 223 lb 1.9 oz (101.2 kg). His oral temperature is 97.9 F (36.6 C). His blood pressure is 139/85 and his pulse is 78. His respiration is 16 and oxygen saturation is 97%.   Wt Readings from Last 3 Encounters:  06/07/16 223 lb 1.9 oz (101.2 kg)  04/05/16 220 lb (99.8 kg)  02/08/16 226 lb (102.5 kg)     Well-developed and well-nourished [place] gentleman in no obvious distress. Head and neck exam shows no ocular or oral lesions. There are no palpable cervical or supraclavicular lymph nodes. He has no scleral icterus. Lungs are clear. Cardiac exam regular rate and rhythm with no murmurs, rubs or bruits. Abdomen is soft. He has good bowel sounds. There is no fluid wave. There is no palpable liver or spleen tip. Back  exam shows no tenderness over the spine, ribs or hips. Extremities shows no clubbing, cyanosis or edema. Skin exam shows no plethora. He has no rashes. He has no ecchymoses or petechia. Neurological exam shows no focal neurological deficits.  Lab Results  Component Value Date   WBC 7.5 06/07/2016   HGB 17.0 06/07/2016   HCT 49.5 06/07/2016   MCV 77 (L) 06/07/2016   PLT 240 06/07/2016     Chemistry      Component Value Date/Time   NA 136 11/16/2015 1432   NA 137 08/17/2015 1305   K 3.9 11/16/2015 1432   K 4.4 08/17/2015 1305   CL 105 11/16/2015 1432   CL 105 04/28/2014 1211   CO2 23 11/16/2015 1432   CO2 21 (L) 08/17/2015 1305   BUN 12 11/16/2015 1432   BUN 16.2 08/17/2015 1305   CREATININE 0.90 11/16/2015 1432   CREATININE 1.0 08/17/2015 1305      Component Value Date/Time   CALCIUM 9.5 11/16/2015 1432   CALCIUM 9.3 08/17/2015 1305   ALKPHOS 121 (H) 11/16/2015 1432   ALKPHOS 126 08/17/2015 1305   AST 77 (H) 11/16/2015 1432   AST 25 08/17/2015 1305   ALT 89 (H) 11/16/2015 1432   ALT 40 08/17/2015 1305   BILITOT 0.6 11/16/2015 1432   BILITOT 0.56 08/17/2015 1305  Impression and Plan: Mr. Goethe is a 32 year old [place] gentleman. He has secondary polycythemia. I do not believe he has primary polycythemia vera. I think that the only concern would be that his erythropoietin level is not all that high.  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  We will go ahead and phlebotomize him.  At the week and then get him back in 3 months. I think this would be reasonable    .12:37 PM

## 2016-06-07 NOTE — Progress Notes (Signed)
Austin Poole presents today for phlebotomy per MD orders. Phlebotomy procedure started at 1300 and ended at 1325. 558 grams removed using 16g phlebotomy kit.  Patient observed for 30 minutes after procedure without any incident. Patient tolerated procedure well. IV needle removed intact.

## 2016-06-07 NOTE — Patient Instructions (Signed)
Therapeutic Phlebotomy Therapeutic phlebotomy is the controlled removal of blood from a person's body for the purpose of treating a medical condition. The procedure is similar to donating blood. Usually, about a pint (470 mL, or 0.47L) of blood is removed. The average adult has 9-12 pints (4.3-5.7 L) of blood. Therapeutic phlebotomy may be used to treat the following medical conditions:  Hemochromatosis. This is a condition in which the blood contains too much iron.  Polycythemia vera. This is a condition in which the blood contains too many red blood cells.  Porphyria cutanea tarda. This is a disease in which an important part of hemoglobin is not made properly. It results in the buildup of abnormal amounts of porphyrins in the body.  Sickle cell disease. This is a condition in which the red blood cells form an abnormal crescent shape rather than a round shape. Tell a health care provider about:  Any allergies you have.  All medicines you are taking, including vitamins, herbs, eye drops, creams, and over-the-counter medicines.  Any problems you or family members have had with anesthetic medicines.  Any blood disorders you have.  Any surgeries you have had.  Any medical conditions you have. What are the risks? Generally, this is a safe procedure. However, problems may occur, including:  Nausea or light-headedness.  Low blood pressure.  Soreness, bleeding, swelling, or bruising at the needle insertion site.  Infection. What happens before the procedure?  Follow instructions from your health care provider about eating or drinking restrictions.  Ask your health care provider about changing or stopping your regular medicines. This is especially important if you are taking diabetes medicines or blood thinners.  Wear clothing with sleeves that can be raised above the elbow.  Plan to have someone take you home after the procedure.  You may have a blood sample taken. What happens  during the procedure?  A needle will be inserted into one of your veins.  Tubing and a collection bag will be attached to that needle.  Blood will flow through the needle and tubing into the collection bag.  You may be asked to open and close your hand slowly and continually during the entire collection.  After the specified amount of blood has been removed from your body, the collection bag and tubing will be clamped.  The needle will be removed from your vein.  Pressure will be held on the site of the needle insertion to stop the bleeding.  A bandage (dressing) will be placed over the needle insertion site. The procedure may vary among health care providers and hospitals. What happens after the procedure?  Your recovery will be assessed and monitored.  You can return to your normal activities as directed by your health care provider. This information is not intended to replace advice given to you by your health care provider. Make sure you discuss any questions you have with your health care provider. Document Released: 06/26/2010 Document Revised: 09/24/2015 Document Reviewed: 01/18/2014 Elsevier Interactive Patient Education  2017 Elsevier Inc.  

## 2016-09-12 ENCOUNTER — Other Ambulatory Visit (HOSPITAL_BASED_OUTPATIENT_CLINIC_OR_DEPARTMENT_OTHER): Payer: No Typology Code available for payment source

## 2016-09-12 ENCOUNTER — Ambulatory Visit (HOSPITAL_BASED_OUTPATIENT_CLINIC_OR_DEPARTMENT_OTHER): Payer: No Typology Code available for payment source

## 2016-09-12 ENCOUNTER — Ambulatory Visit (HOSPITAL_BASED_OUTPATIENT_CLINIC_OR_DEPARTMENT_OTHER): Payer: No Typology Code available for payment source | Admitting: Hematology & Oncology

## 2016-09-12 VITALS — BP 136/79 | HR 76 | Temp 98.0°F | Resp 17 | Wt 225.0 lb

## 2016-09-12 DIAGNOSIS — E611 Iron deficiency: Secondary | ICD-10-CM

## 2016-09-12 DIAGNOSIS — D751 Secondary polycythemia: Secondary | ICD-10-CM

## 2016-09-12 LAB — CBC WITH DIFFERENTIAL (CANCER CENTER ONLY)
BASO#: 0 10*3/uL (ref 0.0–0.2)
BASO%: 0.2 % (ref 0.0–2.0)
EOS ABS: 0.3 10*3/uL (ref 0.0–0.5)
EOS%: 3.6 % (ref 0.0–7.0)
HCT: 50 % — ABNORMAL HIGH (ref 38.7–49.9)
HEMOGLOBIN: 17.4 g/dL — AB (ref 13.0–17.1)
LYMPH#: 3.2 10*3/uL (ref 0.9–3.3)
LYMPH%: 39.5 % (ref 14.0–48.0)
MCH: 27.1 pg — AB (ref 28.0–33.4)
MCHC: 34.8 g/dL (ref 32.0–35.9)
MCV: 78 fL — AB (ref 82–98)
MONO#: 0.7 10*3/uL (ref 0.1–0.9)
MONO%: 8.5 % (ref 0.0–13.0)
NEUT%: 48.2 % (ref 40.0–80.0)
NEUTROS ABS: 3.9 10*3/uL (ref 1.5–6.5)
Platelets: 239 10*3/uL (ref 145–400)
RBC: 6.41 10*6/uL — ABNORMAL HIGH (ref 4.20–5.70)
RDW: 17.8 % — ABNORMAL HIGH (ref 11.1–15.7)
WBC: 8 10*3/uL (ref 4.0–10.0)

## 2016-09-12 LAB — IRON AND TIBC
%SAT: 33 % (ref 20–55)
Iron: 119 ug/dL (ref 42–163)
TIBC: 356 ug/dL (ref 202–409)
UIBC: 237 ug/dL (ref 117–376)

## 2016-09-12 LAB — FERRITIN: Ferritin: 24 ng/ml (ref 22–316)

## 2016-09-12 NOTE — Progress Notes (Signed)
Hematology and Oncology Follow Up Visit  Austin Poole [id number] [birth year] 31 y.o. 09/12/2016   Principle Diagnosis:   Secondary polycythemia  Current Therapy:    Aspirin 162 mg by mouth daily  Phlebotomy to maintain hematocrit below 48%.     Interim History:  [name] is back for follow-up. He is doing okay. He is doing okay. He is still smoking. He says he smokes about 1 or 2 cigarettes a day.  We have made him iron deficient. Back in January, his ferritin was 15 with iron saturation of 18%.  He's had no headache. He's had no visual changes.  He is still working out. He's had no problems with fatigue or stamina.  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  He's had no fever.  There's been no cough. He's had no shortness of breath.   He's had no problems with nausea or vomiting.   Overall, his performance status is ECOG 0.  Medications:  Current Outpatient Prescriptions:  .  aspirin 325 MG tablet, Take 325 mg by mouth daily. Reported on 03/09/2015, Disp: , Rfl:   Allergies: No Known Allergies  Past Medical History, Surgical history, Social history, and Family History were reviewed and updated.  Review of Systems: As above  Physical Exam:  weight is 225 lb (102.1 kg). His oral temperature is 98 F (36.7 C). His blood pressure is 136/79 and his pulse is 76. His respiration is 17 and oxygen saturation is 100%.   Wt Readings from Last 3 Encounters:  09/12/16 225 lb (102.1 kg)  06/07/16 223 lb 1.9 oz (101.2 kg)  04/05/16 220 lb (99.8 kg)     Well-developed and well-nourished [place] gentleman in no obvious distress. Head and neck exam shows no ocular or oral lesions. There are no palpable cervical or supraclavicular lymph nodes. He has no scleral icterus. Lungs are clear. Cardiac exam regular rate and rhythm with no murmurs, rubs or bruits. Abdomen is soft. He has good bowel sounds. There is no fluid wave. There is no palpable  liver or spleen tip. Back exam shows no tenderness over the spine, ribs or hips. Extremities shows no clubbing, cyanosis or edema. Skin exam shows no plethora. He has no rashes. He has no ecchymoses or petechia. Neurological exam shows no focal neurological deficits.  Lab Results  Component Value Date   WBC 8.0 09/12/2016   HGB 17.4 (H) 09/12/2016   HCT 50.0 (H) 09/12/2016   MCV 78 (L) 09/12/2016   PLT 239 09/12/2016     Chemistry      Component Value Date/Time   NA 136 11/16/2015 1432   NA 137 08/17/2015 1305   K 3.9 11/16/2015 1432   K 4.4 08/17/2015 1305   CL 105 11/16/2015 1432   CL 105 04/28/2014 1211   CO2 23 11/16/2015 1432   CO2 21 (L) 08/17/2015 1305   BUN 12 11/16/2015 1432   BUN 16.2 08/17/2015 1305   CREATININE 0.90 11/16/2015 1432   CREATININE 1.0 08/17/2015 1305      Component Value Date/Time   CALCIUM 9.5 11/16/2015 1432   CALCIUM 9.3 08/17/2015 1305   ALKPHOS 121 (H) 11/16/2015 1432   ALKPHOS 126 08/17/2015 1305   AST 77 (H) 11/16/2015 1432   AST 25 08/17/2015 1305   ALT 89 (H) 11/16/2015 1432   ALT 40 08/17/2015 1305   BILITOT 0.6 11/16/2015 1432   BILITOT 0.56 08/17/2015 1305  Impression and Plan: [name] is a 31 year old [place] gentleman. He has secondary polycythemia. I do not believe he has primary polycythemia vera. Despite the iron deficiency, he is still able to mount an erythrocytosis.  We will go ahead and phlebotomize him today.  I know that he is still smoking a little bit. We will just have to be careful with that.  I will plan to see him back in 3 months.  .10:41 AM

## 2016-12-12 ENCOUNTER — Encounter: Payer: Self-pay | Admitting: Hematology & Oncology

## 2016-12-12 ENCOUNTER — Other Ambulatory Visit (HOSPITAL_BASED_OUTPATIENT_CLINIC_OR_DEPARTMENT_OTHER): Payer: No Typology Code available for payment source

## 2016-12-12 ENCOUNTER — Ambulatory Visit (HOSPITAL_BASED_OUTPATIENT_CLINIC_OR_DEPARTMENT_OTHER): Payer: No Typology Code available for payment source | Admitting: Hematology & Oncology

## 2016-12-12 ENCOUNTER — Ambulatory Visit (HOSPITAL_BASED_OUTPATIENT_CLINIC_OR_DEPARTMENT_OTHER): Payer: No Typology Code available for payment source

## 2016-12-12 ENCOUNTER — Other Ambulatory Visit: Payer: Self-pay

## 2016-12-12 VITALS — BP 142/86 | HR 74 | Temp 98.2°F | Resp 20 | Wt 227.8 lb

## 2016-12-12 VITALS — BP 130/76 | HR 90

## 2016-12-12 DIAGNOSIS — E611 Iron deficiency: Secondary | ICD-10-CM

## 2016-12-12 DIAGNOSIS — R072 Precordial pain: Secondary | ICD-10-CM

## 2016-12-12 DIAGNOSIS — D751 Secondary polycythemia: Secondary | ICD-10-CM

## 2016-12-12 DIAGNOSIS — F17210 Nicotine dependence, cigarettes, uncomplicated: Secondary | ICD-10-CM

## 2016-12-12 LAB — CBC WITH DIFFERENTIAL (CANCER CENTER ONLY)
BASO#: 0 10*3/uL (ref 0.0–0.2)
BASO%: 0.3 % (ref 0.0–2.0)
EOS ABS: 0.4 10*3/uL (ref 0.0–0.5)
EOS%: 3.9 % (ref 0.0–7.0)
HEMATOCRIT: 51.2 % — AB (ref 38.7–49.9)
HEMOGLOBIN: 17.9 g/dL — AB (ref 13.0–17.1)
LYMPH#: 2.8 10*3/uL (ref 0.9–3.3)
LYMPH%: 29.8 % (ref 14.0–48.0)
MCH: 27.3 pg — AB (ref 28.0–33.4)
MCHC: 35 g/dL (ref 32.0–35.9)
MCV: 78 fL — AB (ref 82–98)
MONO#: 0.6 10*3/uL (ref 0.1–0.9)
MONO%: 6.7 % (ref 0.0–13.0)
NEUT%: 59.3 % (ref 40.0–80.0)
NEUTROS ABS: 5.6 10*3/uL (ref 1.5–6.5)
Platelets: 238 10*3/uL (ref 145–400)
RBC: 6.55 10*6/uL — ABNORMAL HIGH (ref 4.20–5.70)
RDW: 16.9 % — ABNORMAL HIGH (ref 11.1–15.7)
WBC: 9.4 10*3/uL (ref 4.0–10.0)

## 2016-12-12 LAB — CMP (CANCER CENTER ONLY)
ALBUMIN: 3.6 g/dL (ref 3.3–5.5)
ALT(SGPT): 48 U/L — ABNORMAL HIGH (ref 10–47)
AST: 35 U/L (ref 11–38)
Alkaline Phosphatase: 112 U/L — ABNORMAL HIGH (ref 26–84)
BUN, Bld: 16 mg/dL (ref 7–22)
CALCIUM: 9.5 mg/dL (ref 8.0–10.3)
CHLORIDE: 106 meq/L (ref 98–108)
CO2: 25 meq/L (ref 18–33)
CREATININE: 1.4 mg/dL — AB (ref 0.6–1.2)
Glucose, Bld: 133 mg/dL — ABNORMAL HIGH (ref 73–118)
Potassium: 3.9 mEq/L (ref 3.3–4.7)
SODIUM: 140 meq/L (ref 128–145)
Total Bilirubin: 0.9 mg/dl (ref 0.20–1.60)
Total Protein: 6.8 g/dL (ref 6.4–8.1)

## 2016-12-12 NOTE — Progress Notes (Signed)
Hematology and Oncology Follow Up Visit  Austin Poole [id number] 06/18/1985 31 y.o. 12/12/2016   Principle Diagnosis:   Secondary polycythemia  Current Therapy:    Aspirin 162 mg by mouth daily  Phlebotomy to maintain hematocrit below 48%.     Interim History:  [name] is back for follow-up.  He is doing okay.  He is quite tired.  He had to take his parents up to [place] to leave [place] to [place].  They cannot get a flight locally.  I must give him a lot of credit for being able to do this.  He gets phlebotomized whenever we see him.  He was last phlebotomized 3 months ago.  He has had no headache.  He has had no fatigue.  He is trying to exercise.  He is trying to cut back from smoking.  He has had no nausea or vomiting.  He has had no cough.  He has had no leg swelling.  We last saw him back in August, his iron studies showed a ferritin of 24 with an iron saturation of 33%.  I told him to make sure he watches what he eats to try to minimize his iron intake.  I told him to not take any multivitamins with vitamin C.  Overall, his performance status is ECOG 0.  Medications:  Current Outpatient Medications:  .  aspirin 325 MG tablet, Take 325 mg by mouth daily. Reported on 03/09/2015, Disp: , Rfl:   Allergies: No Known Allergies  Past Medical History, Surgical history, Social history, and Family History were reviewed and updated.  Review of Systems: As in the interim history  Physical Exam:  weight is 227 lb 12 oz (103.3 kg). His oral temperature is 98.2 F (36.8 C). His blood pressure is 142/86 (abnormal) and his pulse is 74. His respiration is 20 and oxygen saturation is 100%.   Wt Readings from Last 3 Encounters:  12/12/16 227 lb 12 oz (103.3 kg)  09/12/16 225 lb (102.1 kg)  06/07/16 223 lb 1.9 oz (101.2 kg)   Well-developed well-nourished [place] male.  Head and neck exam shows no ocular or oral lesions.  There are no palpable cervical or supraclavicular  lymph nodes.  Lungs are clear.  Cardiac exam regular rate and rhythm with no murmurs, rubs or bruits.  Abdomen is soft.  He has good bowel sounds.  There is no fluid wave.  There is no palpable liver or spleen tip.  Extremities shows no clubbing, cyanosis or edema.  Skin exam shows no ruddy complexion.  He has no petechia.  Neurological exam shows no focal neurological deficits.  Back exam shows no tenderness over the spine, ribs or hips.   Lab Results  Component Value Date   WBC 9.4 12/12/2016   HGB 17.9 (H) 12/12/2016   HCT 51.2 (H) 12/12/2016   MCV 78 (L) 12/12/2016   PLT 238 12/12/2016     Chemistry      Component Value Date/Time   NA 140 12/12/2016 1420   NA 137 08/17/2015 1305   K 3.9 12/12/2016 1420   K 4.4 08/17/2015 1305   CL 106 12/12/2016 1420   CO2 25 12/12/2016 1420   CO2 21 (L) 08/17/2015 1305   BUN 16 12/12/2016 1420   BUN 16.2 08/17/2015 1305   CREATININE 1.4 (H) 12/12/2016 1420   CREATININE 1.0 08/17/2015 1305      Component Value Date/Time   CALCIUM 9.5 12/12/2016 1420   CALCIUM 9.3 08/17/2015  1305   ALKPHOS 112 (H) 12/12/2016 1420   ALKPHOS 126 08/17/2015 1305   AST 35 12/12/2016 1420   AST 25 08/17/2015 1305   ALT 48 (H) 12/12/2016 1420   ALT 40 08/17/2015 1305   BILITOT 0.90 12/12/2016 1420   BILITOT 0.56 08/17/2015 1305         Impression and Plan: [name] is a 31 year old [place] gentleman. He has secondary polycythemia. I do not believe he has primary polycythemia vera. Despite the iron deficiency, he is still able to mount an erythrocytosis.  He got phlebotomized today.  He wants to come back after the Christmas holiday.  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  We will plan to get him back in about 7 or 8 weeks.   I encouraged him to continue to stop smoking.   Marland Kitchen3:50 PM

## 2016-12-12 NOTE — Patient Instructions (Signed)
Therapeutic Phlebotomy Therapeutic phlebotomy is the controlled removal of blood from a person's body for the purpose of treating a medical condition. The procedure is similar to donating blood. Usually, about a pint (470 mL, or 0.47L) of blood is removed. The average adult has 9-12 pints (4.3-5.7 L) of blood. Therapeutic phlebotomy may be used to treat the following medical conditions:  Hemochromatosis. This is a condition in which the blood contains too much iron.  Polycythemia vera. This is a condition in which the blood contains too many red blood cells.  Porphyria cutanea tarda. This is a disease in which an important part of hemoglobin is not made properly. It results in the buildup of abnormal amounts of porphyrins in the body.  Sickle cell disease. This is a condition in which the red blood cells form an abnormal crescent shape rather than a round shape.  Tell a health care provider about:  Any allergies you have.  All medicines you are taking, including vitamins, herbs, eye drops, creams, and over-the-counter medicines.  Any problems you or family members have had with anesthetic medicines.  Any blood disorders you have.  Any surgeries you have had.  Any medical conditions you have. What are the risks? Generally, this is a safe procedure. However, problems may occur, including:  Nausea or light-headedness.  Low blood pressure.  Soreness, bleeding, swelling, or bruising at the needle insertion site.  Infection.  What happens before the procedure?  Follow instructions from your health care provider about eating or drinking restrictions.  Ask your health care provider about changing or stopping your regular medicines. This is especially important if you are taking diabetes medicines or blood thinners.  Wear clothing with sleeves that can be raised above the elbow.  Plan to have someone take you home after the procedure.  You may have a blood sample taken. What  happens during the procedure?  A needle will be inserted into one of your veins.  Tubing and a collection bag will be attached to that needle.  Blood will flow through the needle and tubing into the collection bag.  You may be asked to open and close your hand slowly and continually during the entire collection.  After the specified amount of blood has been removed from your body, the collection bag and tubing will be clamped.  The needle will be removed from your vein.  Pressure will be held on the site of the needle insertion to stop the bleeding.  A bandage (dressing) will be placed over the needle insertion site. The procedure may vary among health care providers and hospitals. What happens after the procedure?  Your recovery will be assessed and monitored.  You can return to your normal activities as directed by your health care provider. This information is not intended to replace advice given to you by your health care provider. Make sure you discuss any questions you have with your health care provider. Document Released: 06/26/2010 Document Revised: 09/24/2015 Document Reviewed: 01/18/2014 Elsevier Interactive Patient Education  2018 [employer] Inc.  

## 2016-12-12 NOTE — Progress Notes (Signed)
Therapeutic phlebotomy performed over 8 minutes using a 16 gauge phlebotomy set. Nourishment provided. Patient tolerated well.   Patient refuses to stay for the 30 minute post phlebotomy observation.

## 2016-12-13 LAB — IRON AND TIBC
%SAT: 18 % — AB (ref 20–55)
IRON: 73 ug/dL (ref 42–163)
TIBC: 394 ug/dL (ref 202–409)
UIBC: 321 ug/dL (ref 117–376)

## 2016-12-13 LAB — FERRITIN: Ferritin: 16 ng/ml — ABNORMAL LOW (ref 22–316)

## 2017-02-13 ENCOUNTER — Inpatient Hospital Stay: Payer: No Typology Code available for payment source

## 2017-02-13 ENCOUNTER — Encounter: Payer: Self-pay | Admitting: Family

## 2017-02-13 ENCOUNTER — Other Ambulatory Visit: Payer: Self-pay

## 2017-02-13 ENCOUNTER — Inpatient Hospital Stay (HOSPITAL_BASED_OUTPATIENT_CLINIC_OR_DEPARTMENT_OTHER): Payer: No Typology Code available for payment source

## 2017-02-13 ENCOUNTER — Inpatient Hospital Stay: Payer: No Typology Code available for payment source | Attending: Family | Admitting: Family

## 2017-02-13 VITALS — BP 107/90

## 2017-02-13 VITALS — BP 136/92 | HR 80 | Temp 97.3°F | Resp 18 | Wt 228.0 lb

## 2017-02-13 DIAGNOSIS — R072 Precordial pain: Secondary | ICD-10-CM

## 2017-02-13 DIAGNOSIS — D751 Secondary polycythemia: Secondary | ICD-10-CM

## 2017-02-13 DIAGNOSIS — E611 Iron deficiency: Secondary | ICD-10-CM | POA: Insufficient documentation

## 2017-02-13 LAB — CMP (CANCER CENTER ONLY)
ALBUMIN: 3.7 g/dL (ref 3.5–5.0)
ALT: 32 U/L (ref 0–55)
AST: 22 U/L (ref 5–34)
Alkaline Phosphatase: 124 U/L (ref 40–150)
Anion gap: 7 (ref 3–11)
BILIRUBIN TOTAL: 0.8 mg/dL (ref 0.2–1.2)
BUN: 14 mg/dL (ref 7–26)
CALCIUM: 9.1 mg/dL (ref 8.4–10.4)
CO2: 24 mmol/L (ref 22–29)
CREATININE: 0.99 mg/dL (ref 0.70–1.30)
Chloride: 106 mmol/L (ref 98–109)
GFR, Est AFR Am: >60 mL/min (ref >60)
GFR, Estimated: >60 mL/min (ref >60)
GLUCOSE: 109 mg/dL (ref 70–140)
Potassium: 3.8 mmol/L (ref 3.5–5.1)
Sodium: 137 mmol/L (ref 136–145)
TOTAL PROTEIN: 6.7 g/dL (ref 6.4–8.3)

## 2017-02-13 LAB — CBC WITH DIFFERENTIAL (CANCER CENTER ONLY)
ABS GRANULOCYTE: 5 10*3/uL (ref 1.5–6.5)
BASOS PCT: 0 %
Basophils Absolute: 0 10*3/uL (ref 0.0–0.1)
Eosinophils Absolute: 0.4 10*3/uL (ref 0.0–0.5)
Eosinophils Relative: 4 %
HEMATOCRIT: 48.9 % (ref 38.7–49.9)
Hemoglobin: 17 g/dL (ref 13.0–17.1)
Lymphocytes Relative: 33 %
Lymphs Abs: 3.1 10*3/uL (ref 0.9–3.3)
MCH: 27.4 pg — ABNORMAL LOW (ref 28.0–33.4)
MCHC: 34.8 g/dL (ref 32.0–35.9)
MCV: 78.7 fL — ABNORMAL LOW (ref 82.0–98.0)
MONOS PCT: 10 %
Monocytes Absolute: 1 10*3/uL — ABNORMAL HIGH (ref 0.1–0.9)
NEUTROS ABS: 5 10*3/uL (ref 1.5–6.5)
NEUTROS PCT: 53 %
Platelet Count: 227 10*3/uL (ref 140–400)
RBC: 6.21 MIL/uL — AB (ref 4.20–5.70)
RDW: 15.8 % — ABNORMAL HIGH (ref 11.1–15.7)
WBC Count: 9.5 10*3/uL (ref 4.0–10.3)

## 2017-02-13 LAB — FERRITIN: Ferritin: 13 ng/mL — ABNORMAL LOW (ref 22–316)

## 2017-02-13 LAB — IRON AND TIBC
Iron: 65 ug/dL (ref 42–163)
SATURATION RATIOS: 18 % — AB (ref 42–163)
TIBC: 361 ug/dL (ref 202–409)
UIBC: 296 ug/dL

## 2017-02-13 NOTE — Progress Notes (Signed)
Hematology and Oncology Follow Up Visit  Austin Poole [id number] 17-Feb-1985 32 y.o. 02/13/2017   Principle Diagnosis:  Secondary polycythemia  Current Therapy:   Aspirin 162 mg by mouth daily Phlebotomy to maintain hematocrit below 48%.   Interim History:  Austin Poole is here today for follow-up. He is doing well but has noticed an increase in his joint pain with his gym workouts. No c/o fatigue.  Hct is 48.9 so we will do a phlebotomy today. In November, his ferritin was 16 and iron saturation was 18%.  He has almost completely stopped smoking and is only having 2-3 cigarettes on the weekend when he is out socially.  No fever, chills, n/v, cough, rash, dizziness, SOB, chest pain, palpitations, abdominal pain or changes in bowel or bladder habits.  No lymphadenopathy found on exam. No episodes of bleeding, no bruising or petechiae.  No swelling, numbness or tingling in his extremities.  He has maintained a good appetite and is staying well hydrated. His weight is stable.   ECOG Performance Status: 1 - Symptomatic but completely ambulatory  Medications:  Allergies as of 02/13/2017   No Known Allergies     Medication List        Accurate as of 02/13/17  9:59 AM. Always use your most recent med list.          aspirin 325 MG tablet Take 325 mg by mouth daily. Reported on 03/09/2015       Allergies: No Known Allergies  Past Medical History, Surgical history, Social history, and Family History were reviewed and updated.  Review of Systems: All other 10 point review of systems is negative.   Physical Exam:  vitals were not taken for this visit.   Wt Readings from Last 3 Encounters:  12/12/16 227 lb 12 oz (103.3 kg)  09/12/16 225 lb (102.1 kg)  06/07/16 223 lb 1.9 oz (101.2 kg)    Ocular: Sclerae unicteric, pupils equal, round and reactive to light Ear-nose-throat: Oropharynx clear, dentition fair Lymphatic: No cervical, supraclavicular or axillary adenopathy Lungs no  rales or rhonchi, good excursion bilaterally Heart regular rate and rhythm, no murmur appreciated Abd soft, nontender, positive bowel sounds, no liver or spleen tip palpated on exam, no fluid wave  MSK no focal spinal tenderness, no joint edema Neuro: non-focal, well-oriented, appropriate affect Breasts: Deferred   Lab Results  Component Value Date   WBC 9.4 12/12/2016   HGB 17.9 (H) 12/12/2016   HCT 48.9 02/13/2017   MCV 78.7 (L) 02/13/2017   PLT 238 12/12/2016   Lab Results  Component Value Date   FERRITIN 16 (L) 12/12/2016   IRON 73 12/12/2016   TIBC 394 12/12/2016   UIBC 321 12/12/2016   IRONPCTSAT 18 (L) 12/12/2016   Lab Results  Component Value Date   RETICCTPCT 1.7 04/28/2014   RBC 6.21 (H) 02/13/2017   RETICCTABS 103.2 04/28/2014   No results found for: KPAFRELGTCHN, LAMBDASER, KAPLAMBRATIO No results found for: IGGSERUM, IGA, IGMSERUM No results found for: [name], A1GS, A2GS, [name], MSPIKE, SPEI   Chemistry      Component Value Date/Time   NA 140 12/12/2016 1420   NA 137 08/17/2015 1305   K 3.9 12/12/2016 1420   K 4.4 08/17/2015 1305   CL 106 12/12/2016 1420   CO2 25 12/12/2016 1420   CO2 21 (L) 08/17/2015 1305   BUN 16 12/12/2016 1420   BUN 16.2 08/17/2015 1305   CREATININE 1.4 (H) 12/12/2016 1420   CREATININE 1.0  08/17/2015 1305      Component Value Date/Time   CALCIUM 9.5 12/12/2016 1420   CALCIUM 9.3 08/17/2015 1305   ALKPHOS 112 (H) 12/12/2016 1420   ALKPHOS 126 08/17/2015 1305   AST 35 12/12/2016 1420   AST 25 08/17/2015 1305   ALT 48 (H) 12/12/2016 1420   ALT 40 08/17/2015 1305   BILITOT 0.90 12/12/2016 1420   BILITOT 0.56 08/17/2015 1305      Impression and Plan: Austin Poole is a pleasant 32 yo [place] gentleman with secondary polycythemia. He is down to 2-3 cigarettes per weekend. Hct today is 48.9% and he is symptomatic with increased joint aches with workouts.  We will phlebotomize him today, he does not  feel that he needs replacement fluids.  We will plan to see him back in another 2 months for follow-up and lab.  He will contact our office with any questions or concerns. We can certainly see him sooner if need be.   Austin Peace, NP 1/9/20199:59 AM

## 2017-02-13 NOTE — Progress Notes (Signed)
Austin Poole presents today for phlebotomy per MD orders. Phlebotomy procedure started at 1025 and ended at 1035. 500 grams removed. Patient observed for 30 minutes after procedure without any incident. Patient tolerated procedure well. IV needle removed intact.

## 2017-02-13 NOTE — Patient Instructions (Addendum)
Therapeutic Phlebotomy Therapeutic phlebotomy is the controlled removal of blood from a person's body for the purpose of treating a medical condition. The procedure is similar to donating blood. Usually, about a pint (470 mL, or 0.47L) of blood is removed. The average adult has 9-12 pints (4.3-5.7 L) of blood. Therapeutic phlebotomy may be used to treat the following medical conditions:  Hemochromatosis. This is a condition in which the blood contains too much iron.  Polycythemia vera. This is a condition in which the blood contains too many red blood cells.  Porphyria cutanea tarda. This is a disease in which an important part of hemoglobin is not made properly. It results in the buildup of abnormal amounts of porphyrins in the body.  Sickle cell disease. This is a condition in which the red blood cells form an abnormal crescent shape rather than a round shape.  Tell a health care provider about:  Any allergies you have.  All medicines you are taking, including vitamins, herbs, eye drops, creams, and over-the-counter medicines.  Any problems you or family members have had with anesthetic medicines.  Any blood disorders you have.  Any surgeries you have had.  Any medical conditions you have. What are the risks? Generally, this is a safe procedure. However, problems may occur, including:  Nausea or light-headedness.  Low blood pressure.  Soreness, bleeding, swelling, or bruising at the needle insertion site.  Infection.  What happens before the procedure?  Follow instructions from your health care provider about eating or drinking restrictions.  Ask your health care provider about changing or stopping your regular medicines. This is especially important if you are taking diabetes medicines or blood thinners.  Wear clothing with sleeves that can be raised above the elbow.  Plan to have someone take you home after the procedure.  You may have a blood sample taken. What  happens during the procedure?  A needle will be inserted into one of your veins.  Tubing and a collection bag will be attached to that needle.  Blood will flow through the needle and tubing into the collection bag.  You may be asked to open and close your hand slowly and continually during the entire collection.  After the specified amount of blood has been removed from your body, the collection bag and tubing will be clamped.  The needle will be removed from your vein.  Pressure will be held on the site of the needle insertion to stop the bleeding.  A bandage (dressing) will be placed over the needle insertion site. The procedure may vary among health care providers and hospitals. What happens after the procedure?  Your recovery will be assessed and monitored.  You can return to your normal activities as directed by your health care provider. This information is not intended to replace advice given to you by your health care provider. Make sure you discuss any questions you have with your health care provider. Document Released: 06/26/2010 Document Revised: 09/24/2015 Document Reviewed: 01/18/2014 Elsevier Interactive Patient Education  2018 [employer]. Therapeutic phlebotomy performed over 8 minutes using a 16 gauge phlebotomy set. Nourishment provided. Patient tolerated well.   Patient refuses to stay for the 30 minute post phlebotomy observation.

## 2017-04-10 ENCOUNTER — Ambulatory Visit: Payer: No Typology Code available for payment source | Admitting: Family

## 2017-04-10 ENCOUNTER — Other Ambulatory Visit: Payer: No Typology Code available for payment source

## 2017-04-17 ENCOUNTER — Other Ambulatory Visit: Payer: Self-pay

## 2017-04-17 ENCOUNTER — Inpatient Hospital Stay: Payer: No Typology Code available for payment source | Attending: Family

## 2017-04-17 ENCOUNTER — Inpatient Hospital Stay: Payer: No Typology Code available for payment source

## 2017-04-17 ENCOUNTER — Encounter: Payer: Self-pay | Admitting: Family

## 2017-04-17 ENCOUNTER — Inpatient Hospital Stay (HOSPITAL_BASED_OUTPATIENT_CLINIC_OR_DEPARTMENT_OTHER): Payer: No Typology Code available for payment source | Admitting: Family

## 2017-04-17 VITALS — BP 130/92 | HR 77 | Temp 97.6°F | Resp 17 | Wt 227.0 lb

## 2017-04-17 DIAGNOSIS — D751 Secondary polycythemia: Secondary | ICD-10-CM

## 2017-04-17 DIAGNOSIS — F17210 Nicotine dependence, cigarettes, uncomplicated: Secondary | ICD-10-CM

## 2017-04-17 DIAGNOSIS — D500 Iron deficiency anemia secondary to blood loss (chronic): Secondary | ICD-10-CM

## 2017-04-17 DIAGNOSIS — Z7982 Long term (current) use of aspirin: Secondary | ICD-10-CM

## 2017-04-17 LAB — FERRITIN: Ferritin: 15 ng/mL — ABNORMAL LOW (ref 22–316)

## 2017-04-17 LAB — CMP (CANCER CENTER ONLY)
ALBUMIN: 3.5 g/dL (ref 3.5–5.0)
ALK PHOS: 111 U/L — AB (ref 26–84)
ALT: 43 U/L (ref 10–47)
AST: 24 U/L (ref 11–38)
Anion gap: 8 (ref 5–15)
BUN: 14 mg/dL (ref 7–22)
CALCIUM: 8.9 mg/dL (ref 8.0–10.3)
CO2: 30 mmol/L (ref 18–33)
CREATININE: 1.1 mg/dL (ref 0.60–1.20)
Chloride: 104 mmol/L (ref 98–108)
GLUCOSE: 118 mg/dL (ref 73–118)
Potassium: 3.6 mmol/L (ref 3.3–4.7)
SODIUM: 142 mmol/L (ref 128–145)
TOTAL PROTEIN: 6.3 g/dL — AB (ref 6.4–8.1)
Total Bilirubin: 0.7 mg/dL (ref 0.2–1.6)

## 2017-04-17 LAB — IRON AND TIBC
IRON: 36 ug/dL — AB (ref 42–163)
Saturation Ratios: 10 % — ABNORMAL LOW (ref 42–163)
TIBC: 346 ug/dL (ref 202–409)
UIBC: 311 ug/dL

## 2017-04-17 LAB — CBC WITH DIFFERENTIAL (CANCER CENTER ONLY)
BASOS ABS: 0 10*3/uL (ref 0.0–0.1)
BASOS PCT: 0 %
EOS ABS: 0.4 10*3/uL (ref 0.0–0.5)
EOS PCT: 5 %
HCT: 46.7 % (ref 38.7–49.9)
HEMOGLOBIN: 15.8 g/dL (ref 13.0–17.1)
LYMPHS ABS: 2.9 10*3/uL (ref 0.9–3.3)
Lymphocytes Relative: 37 %
MCH: 26.7 pg — AB (ref 28.0–33.4)
MCHC: 33.8 g/dL (ref 32.0–35.9)
MCV: 79 fL — ABNORMAL LOW (ref 82.0–98.0)
Monocytes Absolute: 0.8 10*3/uL (ref 0.1–0.9)
Monocytes Relative: 10 %
NEUTROS PCT: 48 %
Neutro Abs: 3.8 10*3/uL (ref 1.5–6.5)
PLATELETS: 247 10*3/uL (ref 145–400)
RBC: 5.91 MIL/uL — AB (ref 4.20–5.70)
RDW: 15 % (ref 11.1–15.7)
WBC: 7.9 10*3/uL (ref 4.0–10.0)

## 2017-04-17 NOTE — Progress Notes (Signed)
Hematology and Oncology Follow Up Visit  Jonahtan Manseau [id number] [birth year] 32 y.o. 04/17/2017   Principle Diagnosis:  Secondary polycythemia - smoking  Current Therapy:   Aspirin 162 mg by mouth daily Phlebotomy to maintain hematocrit below 48%   Interim History:  Mr. Perezperez is here today for follow-up. He is doing well but has some fatigue. 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  He has reduced his smoking to 4-5 cigarettes a week. His Hct today is 46.7.  No episodes of bleeding. No bruising or petechiae.  He has had no issue with infections. No fever, chills, n/v, cough, rash, dizziness, SOB, chest pain, palpitations, abdominal pain or changes in bowel or bladder habits.  No swelling, tenderness, numbness or tingling in his extremities. No c/o pain.  No lymphadenopathy noted on exam. He has maintained a good appetite and is staying well hydrated. His weight is stable.    ECOG Performance Status: 1 - Symptomatic but completely ambulatory  Medications:  Allergies as of 04/17/2017   No Known Allergies     Medication List        Accurate as of 04/17/17  9:37 AM. Always use your most recent med list.          aspirin 325 MG tablet Take 325 mg by mouth daily. Reported on 03/09/2015       Allergies: No Known Allergies  Past Medical History, Surgical history, Social history, and Family History were reviewed and updated.  Review of Systems: All other 10 point review of systems is negative.   Physical Exam:  weight is 227 lb (103 kg). His oral temperature is 97.6 F (36.4 C). His blood pressure is 130/92 (abnormal) and his pulse is 77. His respiration is 17 and oxygen saturation is 100%.   Wt Readings from Last 3 Encounters:  04/17/17 227 lb (103 kg)  02/13/17 228 lb (103.4 kg)  12/12/16 227 lb 12 oz (103.3 kg)    Ocular: Sclerae unicteric, pupils equal, round and reactive  to light Ear-nose-throat: Oropharynx clear, dentition fair Lymphatic: No cervical, supraclavicular or axillary adenopathy Lungs no rales or rhonchi, good excursion bilaterally Heart regular rate and rhythm, no murmur appreciated Abd soft, nontender, positive bowel sounds, no liver or spleen tip palpated on exam, no fluid wave  MSK no focal spinal tenderness, no joint edema Neuro: non-focal, well-oriented, appropriate affect Breasts: Deferred   Lab Results  Component Value Date   WBC 7.9 04/17/2017   HGB 17.9 (H) 12/12/2016   HCT 46.7 04/17/2017   MCV 79.0 (L) 04/17/2017   PLT 247 04/17/2017   Lab Results  Component Value Date   FERRITIN 13 (L) 02/13/2017   IRON 65 02/13/2017   TIBC 361 02/13/2017   UIBC 296 02/13/2017   IRONPCTSAT 18 (L) 02/13/2017   Lab Results  Component Value Date   RETICCTPCT 1.7 04/28/2014   RBC 5.91 (H) 04/17/2017   RETICCTABS 103.2 04/28/2014   No results found for: KPAFRELGTCHN, LAMBDASER, KAPLAMBRATIO No results found for: IGGSERUM, IGA, IGMSERUM No results found for: Odetta Pink, SPEI   Chemistry      Component Value Date/Time   NA 137 02/13/2017 0917   NA 140 12/12/2016 1420   NA 137 08/17/2015 1305   K 3.8 02/13/2017 0917   K 3.9 12/12/2016 1420   K 4.4 08/17/2015 1305   CL 106 02/13/2017 0917  CL 106 12/12/2016 1420   CO2 24 02/13/2017 0917   CO2 25 12/12/2016 1420   CO2 21 (L) 08/17/2015 1305   BUN 14 02/13/2017 0917   BUN 16 12/12/2016 1420   BUN 16.2 08/17/2015 1305   CREATININE 0.99 02/13/2017 0917   CREATININE 1.4 (H) 12/12/2016 1420   CREATININE 1.0 08/17/2015 1305      Component Value Date/Time   CALCIUM 9.1 02/13/2017 0917   CALCIUM 9.5 12/12/2016 1420   CALCIUM 9.3 08/17/2015 1305   ALKPHOS 124 02/13/2017 0917   ALKPHOS 112 (H) 12/12/2016 1420   ALKPHOS 126 08/17/2015 1305   AST 22 02/13/2017 0917   AST 25 08/17/2015 1305   ALT 32 02/13/2017 0917   ALT 48  (H) 12/12/2016 1420   ALT 40 08/17/2015 1305   BILITOT 0.8 02/13/2017 0917   BILITOT 0.56 08/17/2015 1305      Impression and Plan: Mr. Mccollam is a very pleasant 32 yo [place] gentleman with secondary polycythemia. 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  Hct today is 46.7 so no phlebotomy needed this visit.  We will plan to see him back in another 2 months for follow-up.  He will contact our office with any questions or concerns. We can certainly see her sooner if need be.   Laverna Peace, NP 3/13/20199:37 AM

## 2017-06-25 ENCOUNTER — Other Ambulatory Visit: Payer: No Typology Code available for payment source

## 2017-06-25 ENCOUNTER — Ambulatory Visit: Payer: No Typology Code available for payment source | Admitting: Family

## 2017-07-12 ENCOUNTER — Inpatient Hospital Stay (HOSPITAL_BASED_OUTPATIENT_CLINIC_OR_DEPARTMENT_OTHER): Payer: No Typology Code available for payment source | Admitting: Family

## 2017-07-12 ENCOUNTER — Inpatient Hospital Stay: Payer: No Typology Code available for payment source | Attending: Family

## 2017-07-12 ENCOUNTER — Inpatient Hospital Stay: Payer: No Typology Code available for payment source

## 2017-07-12 ENCOUNTER — Encounter: Payer: Self-pay | Admitting: Family

## 2017-07-12 ENCOUNTER — Other Ambulatory Visit: Payer: Self-pay

## 2017-07-12 VITALS — BP 111/71 | HR 65 | Resp 17

## 2017-07-12 VITALS — BP 139/80 | HR 79 | Temp 97.8°F | Resp 18 | Wt 222.0 lb

## 2017-07-12 DIAGNOSIS — D751 Secondary polycythemia: Secondary | ICD-10-CM

## 2017-07-12 DIAGNOSIS — D500 Iron deficiency anemia secondary to blood loss (chronic): Secondary | ICD-10-CM

## 2017-07-12 LAB — CMP (CANCER CENTER ONLY)
ALT: 42 U/L (ref 0–55)
ANION GAP: 10 (ref 3–11)
AST: 30 U/L (ref 5–34)
Albumin: 4.1 g/dL (ref 3.5–5.0)
Alkaline Phosphatase: 114 U/L (ref 40–150)
BILIRUBIN TOTAL: 1.1 mg/dL (ref 0.2–1.2)
BUN: 16 mg/dL (ref 7–26)
CHLORIDE: 107 mmol/L (ref 98–109)
CO2: 21 mmol/L — ABNORMAL LOW (ref 22–29)
Calcium: 9.5 mg/dL (ref 8.4–10.4)
Creatinine: 1.02 mg/dL (ref 0.70–1.30)
Glucose, Bld: 131 mg/dL (ref 70–140)
POTASSIUM: 3.7 mmol/L (ref 3.5–5.1)
Sodium: 138 mmol/L (ref 136–145)
TOTAL PROTEIN: 7.1 g/dL (ref 6.4–8.3)

## 2017-07-12 LAB — CBC WITH DIFFERENTIAL (CANCER CENTER ONLY)
Basophils Absolute: 0 10*3/uL (ref 0.0–0.1)
Basophils Relative: 0 %
EOS PCT: 6 %
Eosinophils Absolute: 0.5 10*3/uL (ref 0.0–0.5)
HEMATOCRIT: 52.2 % — AB (ref 38.7–49.9)
Hemoglobin: 17.8 g/dL — ABNORMAL HIGH (ref 13.0–17.1)
LYMPHS ABS: 2.3 10*3/uL (ref 0.9–3.3)
LYMPHS PCT: 28 %
MCH: 26.4 pg — AB (ref 28.0–33.4)
MCHC: 34.1 g/dL (ref 32.0–35.9)
MCV: 77.6 fL — ABNORMAL LOW (ref 82.0–98.0)
MONO ABS: 0.7 10*3/uL (ref 0.1–0.9)
MONOS PCT: 9 %
NEUTROS ABS: 4.8 10*3/uL (ref 1.5–6.5)
Neutrophils Relative %: 57 %
PLATELETS: 230 10*3/uL (ref 145–400)
RBC: 6.73 MIL/uL — ABNORMAL HIGH (ref 4.20–5.70)
RDW: 18 % — ABNORMAL HIGH (ref 11.1–15.7)
WBC Count: 8.4 10*3/uL (ref 4.0–10.0)

## 2017-07-12 LAB — FERRITIN: Ferritin: 14 ng/mL — ABNORMAL LOW (ref 22–316)

## 2017-07-12 LAB — IRON AND TIBC
IRON: 84 ug/dL (ref 42–163)
Saturation Ratios: 22 % — ABNORMAL LOW (ref 42–163)
TIBC: 378 ug/dL (ref 202–409)
UIBC: 293 ug/dL

## 2017-07-12 NOTE — Progress Notes (Signed)
Austin Poole presents today for phlebotomy per MD orders. Phlebotomy procedure started at 1022 and ended at 1030 with 550 grams removed via 16 to [place]. Patient observed for 30 minutes after procedure without any incident. Patient tolerated procedure well. Diet and nutrition offered

## 2017-07-12 NOTE — Progress Notes (Signed)
Hematology and Oncology Follow Up Visit  Austin Poole [id number] [birth year] 32 y.o. 07/12/2017   Principle Diagnosis:  Secondary polycythemia - smoking  Current Therapy:   Aspirin 162 mg by mouth daily Phlebotomy to maintain hematocrit below 48%   Interim History:  [name] is here today for follow-up. He is doing well but does have some occasional fatigue.  His Hct is up quite a bit to 52.2. He states that he has been smoking a little more the last few days.  He has a headache every now and again. No fever, chills, n/v, cough, rash, dizziness, blurred vision, hot flashes, night sweats, SOB, chest pain, palpitations, abdominal pain or changes in bowel or bladder habits.  No swelling, tenderness, numbness or tingling in his extremities. No c/o pain.  He has maintained a good appetite and is staying well hydrated. His weight is stable.  He still goes to the gym regularly to exercise.   ECOG Performance Status: 1 - Symptomatic but completely ambulatory  Medications:  Allergies as of 07/12/2017   No Known Allergies     Medication List        Accurate as of 07/12/17  9:50 AM. Always use your most recent med list.          aspirin 325 MG tablet Take 325 mg by mouth daily. Reported on 03/09/2015       Allergies: No Known Allergies  Past Medical History, Surgical history, Social history, and Family History were reviewed and updated.  Review of Systems: All other 10 point review of systems is negative.   Physical Exam:  vitals were not taken for this visit.   Wt Readings from Last 3 Encounters:  04/17/17 227 lb (103 kg)  02/13/17 228 lb (103.4 kg)  12/12/16 227 lb 12 oz (103.3 kg)    Ocular: Sclerae unicteric, pupils equal, round and reactive to light Ear-nose-throat: Oropharynx clear, dentition fair Lymphatic: No cervical, supraclavicular or axillary adenopathy Lungs no rales or rhonchi, good excursion bilaterally Heart regular rate and rhythm, no murmur  appreciated Abd soft, nontender, positive bowel sounds, no liver or spleen tip palpated on exam, no fluid wave  MSK no focal spinal tenderness, no joint edema Neuro: non-focal, well-oriented, appropriate affect Breasts: Deferred   Lab Results  Component Value Date   WBC 7.9 04/17/2017   HGB 15.8 04/17/2017   HCT 46.7 04/17/2017   MCV 79.0 (L) 04/17/2017   PLT 247 04/17/2017   Lab Results  Component Value Date   FERRITIN 15 (L) 04/17/2017   IRON 36 (L) 04/17/2017   TIBC 346 04/17/2017   UIBC 311 04/17/2017   IRONPCTSAT 10 (L) 04/17/2017   Lab Results  Component Value Date   RETICCTPCT 1.7 04/28/2014   RBC 5.91 (H) 04/17/2017   RETICCTABS 103.2 04/28/2014   No results found for: KPAFRELGTCHN, LAMBDASER, KAPLAMBRATIO No results found for: IGGSERUM, IGA, IGMSERUM No results found for: Odetta Pink, SPEI   Chemistry      Component Value Date/Time   NA 142 04/17/2017 0853   NA 140 12/12/2016 1420   NA 137 08/17/2015 1305   K 3.6 04/17/2017 0853   K 3.9 12/12/2016 1420   K 4.4 08/17/2015 1305   CL 104 04/17/2017 0853   CL 106 12/12/2016 1420   CO2 30 04/17/2017 0853   CO2 25 12/12/2016 1420   CO2 21 (L) 08/17/2015 1305   BUN 14 04/17/2017 0853   BUN 16 12/12/2016 1420  BUN 16.2 08/17/2015 1305   CREATININE 1.10 04/17/2017 0853   CREATININE 1.4 (H) 12/12/2016 1420   CREATININE 1.0 08/17/2015 1305      Component Value Date/Time   CALCIUM 8.9 04/17/2017 0853   CALCIUM 9.5 12/12/2016 1420   CALCIUM 9.3 08/17/2015 1305   ALKPHOS 111 (H) 04/17/2017 0853   ALKPHOS 112 (H) 12/12/2016 1420   ALKPHOS 126 08/17/2015 1305   AST 24 04/17/2017 0853   AST 25 08/17/2015 1305   ALT 43 04/17/2017 0853   ALT 48 (H) 12/12/2016 1420   ALT 40 08/17/2015 1305   BILITOT 0.7 04/17/2017 0853   BILITOT 0.56 08/17/2015 1305      Impression and Plan: [name] is a pleasant 32 yo [place] gentleman with secondary polycythemia. He  continues to do well but still has some episodes of fatigue.  Hct is up at 52.2 so we will phlebotomize him today and again next week.  We will see him back in another 3 months for follow-up.  He will contact our office with any questions or concerns. We can certainly see her sooner if need be.   Laverna Peace, NP 6/7/20199:50 AM

## 2017-07-22 ENCOUNTER — Inpatient Hospital Stay: Payer: No Typology Code available for payment source

## 2017-07-22 VITALS — BP 136/82 | HR 72 | Temp 98.0°F | Resp 16

## 2017-07-22 DIAGNOSIS — D751 Secondary polycythemia: Secondary | ICD-10-CM

## 2017-07-22 NOTE — Patient Instructions (Signed)
Therapeutic Phlebotomy Therapeutic phlebotomy is the controlled removal of blood from a person's body for the purpose of treating a medical condition. The procedure is similar to donating blood. Usually, about a pint (470 mL, or 0.47L) of blood is removed. The average adult has 9-12 pints (4.3-5.7 L) of blood. Therapeutic phlebotomy may be used to treat the following medical conditions:  Hemochromatosis. This is a condition in which the blood contains too much iron.  Polycythemia vera. This is a condition in which the blood contains too many red blood cells.  Porphyria cutanea tarda. This is a disease in which an important part of hemoglobin is not made properly. It results in the buildup of abnormal amounts of porphyrins in the body.  Sickle cell disease. This is a condition in which the red blood cells form an abnormal crescent shape rather than a round shape.  Tell a health care provider about:  Any allergies you have.  All medicines you are taking, including vitamins, herbs, eye drops, creams, and over-the-counter medicines.  Any problems you or family members have had with anesthetic medicines.  Any blood disorders you have.  Any surgeries you have had.  Any medical conditions you have. What are the risks? Generally, this is a safe procedure. However, problems may occur, including:  Nausea or light-headedness.  Low blood pressure.  Soreness, bleeding, swelling, or bruising at the needle insertion site.  Infection.  What happens before the procedure?  Follow instructions from your health care provider about eating or drinking restrictions.  Ask your health care provider about changing or stopping your regular medicines. This is especially important if you are taking diabetes medicines or blood thinners.  Wear clothing with sleeves that can be raised above the elbow.  Plan to have someone take you home after the procedure.  You may have a blood sample taken. What  happens during the procedure?  A needle will be inserted into one of your veins.  Tubing and a collection bag will be attached to that needle.  Blood will flow through the needle and tubing into the collection bag.  You may be asked to open and close your hand slowly and continually during the entire collection.  After the specified amount of blood has been removed from your body, the collection bag and tubing will be clamped.  The needle will be removed from your vein.  Pressure will be held on the site of the needle insertion to stop the bleeding.  A bandage (dressing) will be placed over the needle insertion site. The procedure may vary among health care providers and hospitals. What happens after the procedure?  Your recovery will be assessed and monitored.  You can return to your normal activities as directed by your health care provider. This information is not intended to replace advice given to you by your health care provider. Make sure you discuss any questions you have with your health care provider. Document Released: 06/26/2010 Document Revised: 09/24/2015 Document Reviewed: 01/18/2014 Elsevier Interactive Patient Education  2018 [employer] Inc.  

## 2017-07-22 NOTE — Progress Notes (Signed)
Ricke Hey presents today for phlebotomy per MD orders. 130g removed from rt [facility] by this RN before clotting off 16g needle  170g removed from lt AC by Melburn Hake, RN before clotting. Judson Roch, NP aware & recommends patient be scheduled for 3rd phlebotomy next week. Pt aware and sent to scheduling at d/c.  Patient observed for 30 minutes after procedure without any incident. Patient tolerated procedure well. IV needle removed intact.

## 2017-07-29 ENCOUNTER — Inpatient Hospital Stay: Payer: No Typology Code available for payment source

## 2017-10-14 ENCOUNTER — Inpatient Hospital Stay: Payer: No Typology Code available for payment source

## 2017-10-14 ENCOUNTER — Encounter: Payer: Self-pay | Admitting: Family

## 2017-10-14 ENCOUNTER — Inpatient Hospital Stay: Payer: No Typology Code available for payment source | Attending: Family | Admitting: Family

## 2017-10-14 ENCOUNTER — Other Ambulatory Visit: Payer: Self-pay

## 2017-10-14 VITALS — BP 142/84 | HR 79 | Temp 97.7°F | Resp 18 | Wt 228.0 lb

## 2017-10-14 VITALS — BP 119/75

## 2017-10-14 DIAGNOSIS — D751 Secondary polycythemia: Secondary | ICD-10-CM | POA: Insufficient documentation

## 2017-10-14 DIAGNOSIS — D500 Iron deficiency anemia secondary to blood loss (chronic): Secondary | ICD-10-CM

## 2017-10-14 DIAGNOSIS — Z7982 Long term (current) use of aspirin: Secondary | ICD-10-CM | POA: Insufficient documentation

## 2017-10-14 LAB — CBC WITH DIFFERENTIAL (CANCER CENTER ONLY)
BASOS ABS: 0 10*3/uL (ref 0.0–0.1)
BASOS PCT: 0 %
EOS ABS: 0.5 10*3/uL (ref 0.0–0.5)
Eosinophils Relative: 5 %
HEMATOCRIT: 49.2 % (ref 38.7–49.9)
HEMOGLOBIN: 16.1 g/dL (ref 13.0–17.1)
Lymphocytes Relative: 35 %
Lymphs Abs: 3.6 10*3/uL — ABNORMAL HIGH (ref 0.9–3.3)
MCH: 24.6 pg — ABNORMAL LOW (ref 28.0–33.4)
MCHC: 32.7 g/dL (ref 32.0–35.9)
MCV: 75.1 fL — ABNORMAL LOW (ref 82.0–98.0)
Monocytes Absolute: 1 10*3/uL — ABNORMAL HIGH (ref 0.1–0.9)
Monocytes Relative: 9 %
NEUTROS PCT: 51 %
Neutro Abs: 5.2 10*3/uL (ref 1.5–6.5)
Platelet Count: 254 10*3/uL (ref 145–400)
RBC: 6.55 MIL/uL — AB (ref 4.20–5.70)
RDW: 17.5 % — ABNORMAL HIGH (ref 11.1–15.7)
WBC: 10.2 10*3/uL — AB (ref 4.0–10.0)

## 2017-10-14 LAB — CMP (CANCER CENTER ONLY)
ALBUMIN: 3.8 g/dL (ref 3.5–5.0)
ALK PHOS: 108 U/L (ref 38–126)
ALT: 40 U/L (ref 0–44)
ANION GAP: 12 (ref 5–15)
AST: 23 U/L (ref 15–41)
BUN: 18 mg/dL (ref 6–20)
CALCIUM: 9.3 mg/dL (ref 8.9–10.3)
CO2: 21 mmol/L — AB (ref 22–32)
Chloride: 109 mmol/L (ref 98–111)
Creatinine: 1.08 mg/dL (ref 0.61–1.24)
GFR, Est AFR Am: >60 mL/min (ref >60)
GFR, Estimated: >60 mL/min (ref >60)
GLUCOSE: 119 mg/dL — AB (ref 70–99)
Potassium: 4 mmol/L (ref 3.5–5.1)
SODIUM: 142 mmol/L (ref 135–145)
Total Bilirubin: 0.4 mg/dL (ref 0.3–1.2)
Total Protein: 7.1 g/dL (ref 6.5–8.1)

## 2017-10-14 LAB — IRON AND TIBC
Iron: 94 ug/dL (ref 42–163)
SATURATION RATIOS: 22 % — AB (ref 42–163)
TIBC: 429 ug/dL — ABNORMAL HIGH (ref 202–409)
UIBC: 335 ug/dL

## 2017-10-14 LAB — FERRITIN: Ferritin: 5 ng/mL — ABNORMAL LOW (ref 24–336)

## 2017-10-14 NOTE — Progress Notes (Signed)
Austin Poole presents today for phlebotomy per MD orders. Phlebotomy procedure started at 1005 and ended at 1010 with  515 grams removed via 16G to [place]. Patient observed for 30 minutes after procedure without any incident. Patient tolerated procedure well. Diet and nutrition offered.  I

## 2017-10-14 NOTE — Progress Notes (Signed)
Hematology and Oncology Follow Up Visit  Austin Poole [id number] [birth year] 32 y.o. 10/14/2017   Principle Diagnosis:  Secondary polycythemia- smoking  Current Therapy:   Aspirin 162 mg by mouth daily Phlebotomy to maintain hematocrit below 48%   Interim History:  [name] is here today for follow-up. He is feeling fatigued and was up all night at the hospital with his mom. She had to have surgery in preparation for dialysis.  His Hct today is 49.2%.  He is still smoking.  He is taking his 2 baby aspirin daily as prescribed. He cut his hand while swimming [name] and was able to stop the bleeding after several minutes. No other episodes of bleeding, no bruising or petechiae.  He has had no fever, chills, n/v, cough, rash, dizziness, SOB, chest pain, palpitations, abdominal pain or changes in bowel or bladder habits.  No swelling, tenderness, numbness or tingling in his extremities.  No lymphadenopathy noted on exam.  He has maintained a good appetite and is staying well hydrated. His weight is stable.   ECOG Performance Status: 1 - Symptomatic but completely ambulatory  Medications:  Allergies as of 10/14/2017   No Known Allergies     Medication List        Accurate as of 10/14/17  9:58 AM. Always use your most recent med list.          aspirin 325 MG tablet Take 325 mg by mouth daily. Reported on 03/09/2015       Allergies: No Known Allergies  Past Medical History, Surgical history, Social history, and Family History were reviewed and updated.  Review of Systems: All other 10 point review of systems is negative.   Physical Exam:  weight is 228 lb (103.4 kg). His oral temperature is 97.7 F (36.5 C). His blood pressure is 142/84 (abnormal) and his pulse is 79. His respiration is 18 and oxygen saturation is 100%.   Wt Readings from Last 3 Encounters:  10/14/17 228 lb (103.4 kg)  07/12/17 222 lb (100.7 kg)  04/17/17 227 lb (103 kg)    Ocular: Sclerae  unicteric, pupils equal, round and reactive to light Ear-nose-throat: Oropharynx clear, dentition fair Lymphatic: No cervical, supraclavicular or axillary adenopathy Lungs no rales or rhonchi, good excursion bilaterally Heart regular rate and rhythm, no murmur appreciated Abd soft, nontender, positive bowel sounds, no liver or spleen tip palpated on exam, no fluid wave  MSK no focal spinal tenderness, no joint edema Neuro: non-focal, well-oriented, appropriate affect Breasts: Deferred   Lab Results  Component Value Date   WBC 10.2 (H) 10/14/2017   HGB 16.1 10/14/2017   HCT 49.2 10/14/2017   MCV 75.1 (L) 10/14/2017   PLT 254 10/14/2017   Lab Results  Component Value Date   FERRITIN 14 (L) 07/12/2017   IRON 84 07/12/2017   TIBC 378 07/12/2017   UIBC 293 07/12/2017   IRONPCTSAT 22 (L) 07/12/2017   Lab Results  Component Value Date   RETICCTPCT 1.7 04/28/2014   RBC 6.55 (H) 10/14/2017   RETICCTABS 103.2 04/28/2014   No results found for: KPAFRELGTCHN, LAMBDASER, KAPLAMBRATIO No results found for: IGGSERUM, IGA, IGMSERUM No results found for: Odetta Pink, SPEI   Chemistry      Component Value Date/Time   NA 138 07/12/2017 0939   NA 140 12/12/2016 1420   NA 137 08/17/2015 1305   K 3.7 07/12/2017 0939   K 3.9 12/12/2016 1420   K 4.4 08/17/2015  1305   CL 107 07/12/2017 0939   CL 106 12/12/2016 1420   CO2 21 (L) 07/12/2017 0939   CO2 25 12/12/2016 1420   CO2 21 (L) 08/17/2015 1305   BUN 16 07/12/2017 0939   BUN 16 12/12/2016 1420   BUN 16.2 08/17/2015 1305   CREATININE 1.02 07/12/2017 0939   CREATININE 1.4 (H) 12/12/2016 1420   CREATININE 1.0 08/17/2015 1305      Component Value Date/Time   CALCIUM 9.5 07/12/2017 0939   CALCIUM 9.5 12/12/2016 1420   CALCIUM 9.3 08/17/2015 1305   ALKPHOS 114 07/12/2017 0939   ALKPHOS 112 (H) 12/12/2016 1420   ALKPHOS 126 08/17/2015 1305   AST 30 07/12/2017 0939   AST 25  08/17/2015 1305   ALT 42 07/12/2017 0939   ALT 48 (H) 12/12/2016 1420   ALT 40 08/17/2015 1305   BILITOT 1.1 07/12/2017 0939   BILITOT 0.56 08/17/2015 1305      Impression and Plan: [name] is a pleasant 32 yo [place] gentleman with secondary polycythemia. He is doing well but is a little tired after staying up last night with his mother.  Hct is 49.3% so we will proceed with phlebotomy.  We will plan to see him back in another 3 months for follow-up.  He will contact our office with any questions or concerns. We can certainly see him sooner if need be.   Laverna Peace, NP 9/9/20199:58 AM

## 2018-01-13 ENCOUNTER — Other Ambulatory Visit: Payer: No Typology Code available for payment source

## 2018-01-13 ENCOUNTER — Ambulatory Visit: Payer: No Typology Code available for payment source | Admitting: Family

## 2018-01-20 ENCOUNTER — Other Ambulatory Visit: Payer: Self-pay

## 2018-01-20 ENCOUNTER — Inpatient Hospital Stay: Payer: No Typology Code available for payment source | Attending: Family

## 2018-01-20 ENCOUNTER — Inpatient Hospital Stay (HOSPITAL_BASED_OUTPATIENT_CLINIC_OR_DEPARTMENT_OTHER): Payer: No Typology Code available for payment source | Admitting: Family

## 2018-01-20 ENCOUNTER — Inpatient Hospital Stay: Payer: No Typology Code available for payment source

## 2018-01-20 VITALS — BP 133/82 | HR 98 | Temp 98.0°F | Resp 20 | Wt 232.5 lb

## 2018-01-20 VITALS — BP 141/88 | HR 88

## 2018-01-20 DIAGNOSIS — D751 Secondary polycythemia: Secondary | ICD-10-CM

## 2018-01-20 DIAGNOSIS — D500 Iron deficiency anemia secondary to blood loss (chronic): Secondary | ICD-10-CM

## 2018-01-20 DIAGNOSIS — Z7982 Long term (current) use of aspirin: Secondary | ICD-10-CM

## 2018-01-20 DIAGNOSIS — F17210 Nicotine dependence, cigarettes, uncomplicated: Secondary | ICD-10-CM | POA: Insufficient documentation

## 2018-01-20 LAB — CBC WITH DIFFERENTIAL (CANCER CENTER ONLY)
Abs Immature Granulocytes: 0.05 10*3/uL (ref 0.00–0.07)
Basophils Absolute: 0 10*3/uL (ref 0.0–0.1)
Basophils Relative: 1 %
EOS ABS: 0.5 10*3/uL (ref 0.0–0.5)
EOS PCT: 6 %
HEMATOCRIT: 54.7 % — AB (ref 39.0–52.0)
Hemoglobin: 16.8 g/dL (ref 13.0–17.0)
Immature Granulocytes: 1 %
LYMPHS ABS: 2.8 10*3/uL (ref 0.7–4.0)
Lymphocytes Relative: 35 %
MCH: 24.3 pg — ABNORMAL LOW (ref 26.0–34.0)
MCHC: 30.7 g/dL (ref 30.0–36.0)
MCV: 79.3 fL — AB (ref 80.0–100.0)
MONOS PCT: 10 %
Monocytes Absolute: 0.8 10*3/uL (ref 0.1–1.0)
Neutro Abs: 3.9 10*3/uL (ref 1.7–7.7)
Neutrophils Relative %: 47 %
Platelet Count: 289 10*3/uL (ref 150–400)
RBC: 6.9 MIL/uL — ABNORMAL HIGH (ref 4.22–5.81)
RDW: 19.8 % — AB (ref 11.5–15.5)
WBC Count: 8.1 10*3/uL (ref 4.0–10.5)
nRBC: 0 % (ref 0.0–0.2)

## 2018-01-20 LAB — CMP (CANCER CENTER ONLY)
ALBUMIN: 4.3 g/dL (ref 3.5–5.0)
ALK PHOS: 100 U/L (ref 38–126)
ALT: 45 U/L — AB (ref 0–44)
AST: 29 U/L (ref 15–41)
Anion gap: 7 (ref 5–15)
BILIRUBIN TOTAL: 0.6 mg/dL (ref 0.3–1.2)
BUN: 17 mg/dL (ref 6–20)
CALCIUM: 9.2 mg/dL (ref 8.9–10.3)
CO2: 27 mmol/L (ref 22–32)
Chloride: 105 mmol/L (ref 98–111)
Creatinine: 1.11 mg/dL (ref 0.61–1.24)
GFR, Est AFR Am: >60 mL/min (ref >60)
GFR, Estimated: >60 mL/min (ref >60)
GLUCOSE: 112 mg/dL — AB (ref 70–99)
Potassium: 4.1 mmol/L (ref 3.5–5.1)
SODIUM: 139 mmol/L (ref 135–145)
TOTAL PROTEIN: 6.4 g/dL — AB (ref 6.5–8.1)

## 2018-01-20 LAB — IRON AND TIBC
IRON: 56 ug/dL (ref 42–163)
Saturation Ratios: 14 % — ABNORMAL LOW (ref 20–55)
TIBC: 409 ug/dL (ref 202–409)
UIBC: 353 ug/dL (ref 117–376)

## 2018-01-20 LAB — FERRITIN: Ferritin: 10 ng/mL — ABNORMAL LOW (ref 24–336)

## 2018-01-20 NOTE — Progress Notes (Signed)
Ricke Hey presents today for phlebotomy per MD orders. Phlebotomy procedure started with 16 g catheter at 1000 and ended at 1008. 526 grams removed. Refreshments offered Patient observed for 30 minutes after procedure without any incident. Patient tolerated procedure well. IV needle removed intact.

## 2018-01-20 NOTE — Patient Instructions (Signed)
Therapeutic Phlebotomy Therapeutic phlebotomy is the controlled removal of blood from a person's body for the purpose of treating a medical condition. The procedure is similar to donating blood. Usually, about a pint (470 mL, or 0.47L) of blood is removed. The average adult has 9-12 pints (4.3-5.7 L) of blood. Therapeutic phlebotomy may be used to treat the following medical conditions:  Hemochromatosis. This is a condition in which the blood contains too much iron.  Polycythemia vera. This is a condition in which the blood contains too many red blood cells.  Porphyria cutanea tarda. This is a disease in which an important part of hemoglobin is not made properly. It results in the buildup of abnormal amounts of porphyrins in the body.  Sickle cell disease. This is a condition in which the red blood cells form an abnormal crescent shape rather than a round shape.  Tell a health care provider about:  Any allergies you have.  All medicines you are taking, including vitamins, herbs, eye drops, creams, and over-the-counter medicines.  Any problems you or family members have had with anesthetic medicines.  Any blood disorders you have.  Any surgeries you have had.  Any medical conditions you have. What are the risks? Generally, this is a safe procedure. However, problems may occur, including:  Nausea or light-headedness.  Low blood pressure.  Soreness, bleeding, swelling, or bruising at the needle insertion site.  Infection.  What happens before the procedure?  Follow instructions from your health care provider about eating or drinking restrictions.  Ask your health care provider about changing or stopping your regular medicines. This is especially important if you are taking diabetes medicines or blood thinners.  Wear clothing with sleeves that can be raised above the elbow.  Plan to have someone take you home after the procedure.  You may have a blood sample taken. What  happens during the procedure?  A needle will be inserted into one of your veins.  Tubing and a collection bag will be attached to that needle.  Blood will flow through the needle and tubing into the collection bag.  You may be asked to open and close your hand slowly and continually during the entire collection.  After the specified amount of blood has been removed from your body, the collection bag and tubing will be clamped.  The needle will be removed from your vein.  Pressure will be held on the site of the needle insertion to stop the bleeding.  A bandage (dressing) will be placed over the needle insertion site. The procedure may vary among health care providers and hospitals. What happens after the procedure?  Your recovery will be assessed and monitored.  You can return to your normal activities as directed by your health care provider. This information is not intended to replace advice given to you by your health care provider. Make sure you discuss any questions you have with your health care provider. Document Released: 06/26/2010 Document Revised: 09/24/2015 Document Reviewed: 01/18/2014 Elsevier Interactive Patient Education  2018 [employer] Inc.  

## 2018-01-20 NOTE — Progress Notes (Signed)
Hematology and Oncology Follow Up Visit  Gunnison Chahal [id number] [birth year] 32 y.o. 01/20/2018   Principle Diagnosis:  Secondary polycythemia- smoking  Current Therapy:   Aspirin 162 mg by mouth daily Phlebotomy to maintain hematocrit below 48%   Interim History:  Mr. Winski is here today for follow-up. 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 He has been smoking quite a bit more.  Hct today is up to 54.7%.  He denies taking any type of testosterone or dietary/enhancement supplements.  He is taking his baby aspirin daily.  He denies feeling fatigued.  No fever, chills, n/v, cough, rash, dizziness, SOB, chest pain, palpitations, abdominal pain or changes in bowel or bladder habits.  No swelling, tenderness, numbness or tingling in his extremities. No c/o pain.  No lymphadenopathy noted on exam.  He has maintained a good appetite and is making sure to stay well hydrated. His weight is stable.   ECOG Performance Status: 0 - Asymptomatic  Medications:  Allergies as of 01/20/2018   No Known Allergies     Medication List       Accurate as of January 20, 2018 10:05 AM. Always use your most recent med list.        aspirin 325 MG tablet Take 325 mg by mouth daily. Reported on 03/09/2015       Allergies: No Known Allergies  Past Medical History, Surgical history, Social history, and Family History were reviewed and updated.  Review of Systems: All other 10 point review of systems is negative.   Physical Exam:  weight is 232 lb 8 oz (105.5 kg). His oral temperature is 98 F (36.7 C). His blood pressure is 133/82 and his pulse is 98. His respiration is 20 and oxygen saturation is 98%.   Wt Readings from Last 3 Encounters:  01/20/18 232 lb 8 oz (105.5 kg)  10/14/17 228 lb (103.4 kg)  07/12/17 222 lb (100.7 kg)    Ocular: Sclerae unicteric, pupils equal, round and reactive to light Ear-nose-throat: Oropharynx  clear, dentition fair Lymphatic: No cervical, supraclavicular or axillary adenopathy Lungs no rales or rhonchi, good excursion bilaterally Heart regular rate and rhythm, no murmur appreciated Abd soft, nontender, positive bowel sounds, no liver or spleen tip palpated on exam, no fluid wave  MSK no focal spinal tenderness, no joint edema Neuro: non-focal, well-oriented, appropriate affect Breasts: Deferred   Lab Results  Component Value Date   WBC 8.1 01/20/2018   HGB 16.8 01/20/2018   HCT 54.7 (H) 01/20/2018   MCV 79.3 (L) 01/20/2018   PLT 289 01/20/2018   Lab Results  Component Value Date   FERRITIN 5 (L) 10/14/2017   IRON 94 10/14/2017   TIBC 429 (H) 10/14/2017   UIBC 335 10/14/2017   IRONPCTSAT 22 (L) 10/14/2017   Lab Results  Component Value Date   RETICCTPCT 1.7 04/28/2014   RBC 6.90 (H) 01/20/2018   RETICCTABS 103.2 04/28/2014   No results found for: KPAFRELGTCHN, LAMBDASER, KAPLAMBRATIO No results found for: IGGSERUM, IGA, IGMSERUM No results found for: [name], A1GS, A2GS, [name] MSPIKE, SPEI   Chemistry      Component Value Date/Time   NA 139 01/20/2018 0913   NA 140 12/12/2016 1420   NA 137 08/17/2015 1305   K 4.1 01/20/2018 0913   K 3.9 12/12/2016 1420   K 4.4 08/17/2015 1305   CL 105 01/20/2018 0913   CL 106 12/12/2016 1420  CO2 27 01/20/2018 0913   CO2 25 12/12/2016 1420   CO2 21 (L) 08/17/2015 1305   BUN 17 01/20/2018 0913   BUN 16 12/12/2016 1420   BUN 16.2 08/17/2015 1305   CREATININE 1.11 01/20/2018 0913   CREATININE 1.4 (H) 12/12/2016 1420   CREATININE 1.0 08/17/2015 1305      Component Value Date/Time   CALCIUM 9.2 01/20/2018 0913   CALCIUM 9.5 12/12/2016 1420   CALCIUM 9.3 08/17/2015 1305   ALKPHOS 100 01/20/2018 0913   ALKPHOS 112 (H) 12/12/2016 1420   ALKPHOS 126 08/17/2015 1305   AST 29 01/20/2018 0913   AST 25 08/17/2015 1305   ALT 45 (H) 01/20/2018 0913   ALT 48 (H) 12/12/2016 1420   ALT 40  08/17/2015 1305   BILITOT 0.6 01/20/2018 0913   BILITOT 0.56 08/17/2015 1305       Impression and Plan: Mr. Rodkey is a pleasant 32 yo [place] gentleman wit secondary polycythemia. He is doing well but is smoking more which has caused an increase in his Hct to 54.7%.  We will phlebtoomize him today and due to commitments with work and the holiday season he states he will have to come back in another month for his next phlebotomy.  He will try to cut back on the smoking. He will continue to take his aspirin daily and hydrate well.  We will see him in a month.  He will contact our office with any questions or concerns. We can certainly see him sooner if need be.   Laverna Peace, NP 12/16/201910:05 AM

## 2018-02-07 ENCOUNTER — Ambulatory Visit: Payer: No Typology Code available for payment source | Admitting: Family

## 2018-02-07 ENCOUNTER — Other Ambulatory Visit: Payer: No Typology Code available for payment source

## 2018-02-17 ENCOUNTER — Telehealth: Payer: Self-pay | Admitting: Family

## 2018-02-17 ENCOUNTER — Encounter: Payer: Self-pay | Admitting: Family

## 2018-02-17 ENCOUNTER — Other Ambulatory Visit: Payer: Self-pay

## 2018-02-17 ENCOUNTER — Inpatient Hospital Stay: Payer: No Typology Code available for payment source | Attending: Family

## 2018-02-17 ENCOUNTER — Inpatient Hospital Stay: Payer: No Typology Code available for payment source

## 2018-02-17 ENCOUNTER — Inpatient Hospital Stay (HOSPITAL_BASED_OUTPATIENT_CLINIC_OR_DEPARTMENT_OTHER): Payer: No Typology Code available for payment source | Admitting: Family

## 2018-02-17 VITALS — BP 122/81 | HR 75 | Temp 97.1°F | Resp 18 | Wt 228.0 lb

## 2018-02-17 VITALS — BP 134/85 | HR 73

## 2018-02-17 DIAGNOSIS — Z7982 Long term (current) use of aspirin: Secondary | ICD-10-CM

## 2018-02-17 DIAGNOSIS — F17210 Nicotine dependence, cigarettes, uncomplicated: Secondary | ICD-10-CM | POA: Insufficient documentation

## 2018-02-17 DIAGNOSIS — D751 Secondary polycythemia: Secondary | ICD-10-CM

## 2018-02-17 DIAGNOSIS — D500 Iron deficiency anemia secondary to blood loss (chronic): Secondary | ICD-10-CM

## 2018-02-17 LAB — CMP (CANCER CENTER ONLY)
ALBUMIN: 4.1 g/dL (ref 3.5–5.0)
ALT: 38 U/L (ref 0–44)
AST: 22 U/L (ref 15–41)
Alkaline Phosphatase: 89 U/L (ref 38–126)
Anion gap: 7 (ref 5–15)
BUN: 16 mg/dL (ref 6–20)
CO2: 28 mmol/L (ref 22–32)
Calcium: 9 mg/dL (ref 8.9–10.3)
Chloride: 105 mmol/L (ref 98–111)
Creatinine: 1.09 mg/dL (ref 0.61–1.24)
GFR, Est AFR Am: >60 mL/min (ref >60)
GFR, Estimated: >60 mL/min (ref >60)
GLUCOSE: 102 mg/dL — AB (ref 70–99)
Potassium: 4.1 mmol/L (ref 3.5–5.1)
Sodium: 140 mmol/L (ref 135–145)
Total Bilirubin: 0.5 mg/dL (ref 0.3–1.2)
Total Protein: 6.4 g/dL — ABNORMAL LOW (ref 6.5–8.1)

## 2018-02-17 LAB — CBC WITH DIFFERENTIAL (CANCER CENTER ONLY)
Abs Immature Granulocytes: 0.03 10*3/uL (ref 0.00–0.07)
BASOS ABS: 0.1 10*3/uL (ref 0.0–0.1)
Basophils Relative: 1 %
EOS PCT: 5 %
Eosinophils Absolute: 0.4 10*3/uL (ref 0.0–0.5)
HCT: 50.5 % (ref 39.0–52.0)
Hemoglobin: 16 g/dL (ref 13.0–17.0)
Immature Granulocytes: 0 %
Lymphocytes Relative: 41 %
Lymphs Abs: 3.5 10*3/uL (ref 0.7–4.0)
MCH: 25 pg — ABNORMAL LOW (ref 26.0–34.0)
MCHC: 31.7 g/dL (ref 30.0–36.0)
MCV: 78.9 fL — ABNORMAL LOW (ref 80.0–100.0)
Monocytes Absolute: 0.9 10*3/uL (ref 0.1–1.0)
Monocytes Relative: 11 %
Neutro Abs: 3.5 10*3/uL (ref 1.7–7.7)
Neutrophils Relative %: 42 %
Platelet Count: 261 10*3/uL (ref 150–400)
RBC: 6.4 MIL/uL — ABNORMAL HIGH (ref 4.22–5.81)
RDW: 18.4 % — ABNORMAL HIGH (ref 11.5–15.5)
WBC Count: 8.3 10*3/uL (ref 4.0–10.5)
nRBC: 0 % (ref 0.0–0.2)

## 2018-02-17 LAB — IRON AND TIBC
Iron: 45 ug/dL (ref 42–163)
Saturation Ratios: 12 % — ABNORMAL LOW (ref 20–55)
TIBC: 383 ug/dL (ref 202–409)
UIBC: 338 ug/dL (ref 117–376)

## 2018-02-17 LAB — FERRITIN: Ferritin: 7 ng/mL — ABNORMAL LOW (ref 24–336)

## 2018-02-17 NOTE — Progress Notes (Signed)
Hematology and Oncology Follow Up Visit  [place] [id number] 1985-04-29 33 y.o. 02/17/2018   Principle Diagnosis:  Secondary polycythemia- smoking  Current Therapy:   Aspirin 162 mg by mouth daily Phlebotomy to maintain hematocrit below 48%   Interim History:  [place] is here today for follow-up. Unfortunately, his father passed away last week. This has been quite hard on him and his family.  His Hct today is 50.5%.  He is taking his 2 baby aspirin daily as prescribed.  No episodes of bleeding, no bruising or petechiae.  No fever, chills, n/v, cough, rash, dizziness, SOB, chest pain, palpitations, abdominal pain or changes in bowel or bladder habits.  No swelling, tenderness, numbness or tingling in his extremities.  No lymphadenopathy noted on exam.  He has maintained a good appetite and is staying well hydrated. His weight is stable.   ECOG Performance Status: 1 - Symptomatic but completely ambulatory  Medications:  Allergies as of 02/17/2018   No Known Allergies     Medication List       Accurate as of February 17, 2018  9:55 AM. Always use your most recent med list.        aspirin 325 MG tablet Take 325 mg by mouth daily. Reported on 03/09/2015       Allergies: No Known Allergies  Past Medical History, Surgical history, Social history, and Family History were reviewed and updated.  Review of Systems: All other 10 point review of systems is negative.   Physical Exam:  weight is 228 lb (103.4 kg). His oral temperature is 97.1 F (36.2 C) (abnormal). His blood pressure is 122/81 and his pulse is 75. His respiration is 18 and oxygen saturation is 100%.   Wt Readings from Last 3 Encounters:  02/17/18 228 lb (103.4 kg)  01/20/18 232 lb 8 oz (105.5 kg)  10/14/17 228 lb (103.4 kg)    Ocular: Sclerae unicteric, pupils equal, round and reactive to light Ear-nose-throat: Oropharynx clear, dentition fair Lymphatic: No cervical, supraclavicular or axillary  adenopathy Lungs no rales or rhonchi, good excursion bilaterally Heart regular rate and rhythm, no murmur appreciated Abd soft, nontender, positive bowel sounds, no liver or spleen tip palpated on exam, no fluid wave  MSK no focal spinal tenderness, no joint edema Neuro: non-focal, well-oriented, appropriate affect Breasts: Deferred   Lab Results  Component Value Date   WBC 8.3 02/17/2018   HGB 16.0 02/17/2018   HCT 50.5 02/17/2018   MCV 78.9 (L) 02/17/2018   PLT 261 02/17/2018   Lab Results  Component Value Date   FERRITIN 10 (L) 01/20/2018   IRON 56 01/20/2018   TIBC 409 01/20/2018   UIBC 353 01/20/2018   IRONPCTSAT 14 (L) 01/20/2018   Lab Results  Component Value Date   RETICCTPCT 1.7 04/28/2014   RBC 6.40 (H) 02/17/2018   RETICCTABS 103.2 04/28/2014   No results found for: KPAFRELGTCHN, LAMBDASER, KAPLAMBRATIO No results found for: IGGSERUM, IGA, IGMSERUM No results found for: Odetta Pink, SPEI   Chemistry      Component Value Date/Time   NA 140 02/17/2018 0919   NA 140 12/12/2016 1420   NA 137 08/17/2015 1305   K 4.1 02/17/2018 0919   K 3.9 12/12/2016 1420   K 4.4 08/17/2015 1305   CL 105 02/17/2018 0919   CL 106 12/12/2016 1420   CO2 28 02/17/2018 0919   CO2 25 12/12/2016 1420   CO2 21 (L) 08/17/2015 1305  BUN 16 02/17/2018 0919   BUN 16 12/12/2016 1420   BUN 16.2 08/17/2015 1305   CREATININE 1.09 02/17/2018 0919   CREATININE 1.4 (H) 12/12/2016 1420   CREATININE 1.0 08/17/2015 1305      Component Value Date/Time   CALCIUM 9.0 02/17/2018 0919   CALCIUM 9.5 12/12/2016 1420   CALCIUM 9.3 08/17/2015 1305   ALKPHOS 89 02/17/2018 0919   ALKPHOS 112 (H) 12/12/2016 1420   ALKPHOS 126 08/17/2015 1305   AST 22 02/17/2018 0919   AST 25 08/17/2015 1305   ALT 38 02/17/2018 0919   ALT 48 (H) 12/12/2016 1420   ALT 40 08/17/2015 1305   BILITOT 0.5 02/17/2018 0919   BILITOT 0.56 08/17/2015 1305        Impression and Plan: [place] Poole is a very pleasant 33 yo [place] gentleman with secondary polycythemia due to smoking.  Hct today is 50.5% so we will proceed with phlebotomy today.  We will see him back in another month for follow-up.  He will contact our office with any questions or concerns. We can certainly see him sooner if need be.   Laverna Peace, NP 1/13/20209:55 AM

## 2018-02-17 NOTE — Progress Notes (Signed)
[place] presents today for phlebotomy per MD orders. Phlebotomy procedure started at 1000 and ended at 1006 with 520 grams removed via 16 G to RAC. Diet and nutrition offered.  Patient observed for 30 minutes after procedure without any incident. Patient tolerated procedure well.

## 2018-02-17 NOTE — Telephone Encounter (Signed)
Appointments scheduled avs declined/claendar printed per 1/13 los

## 2018-03-24 ENCOUNTER — Inpatient Hospital Stay: Payer: No Typology Code available for payment source | Attending: Family

## 2018-03-24 ENCOUNTER — Inpatient Hospital Stay (HOSPITAL_BASED_OUTPATIENT_CLINIC_OR_DEPARTMENT_OTHER): Payer: No Typology Code available for payment source | Admitting: Family

## 2018-03-24 ENCOUNTER — Encounter: Payer: Self-pay | Admitting: Family

## 2018-03-24 ENCOUNTER — Inpatient Hospital Stay: Payer: No Typology Code available for payment source

## 2018-03-24 ENCOUNTER — Other Ambulatory Visit: Payer: Self-pay

## 2018-03-24 VITALS — BP 144/84 | HR 82 | Temp 97.9°F | Resp 18 | Wt 228.5 lb

## 2018-03-24 VITALS — BP 130/80

## 2018-03-24 DIAGNOSIS — D751 Secondary polycythemia: Secondary | ICD-10-CM

## 2018-03-24 DIAGNOSIS — Z7982 Long term (current) use of aspirin: Secondary | ICD-10-CM | POA: Insufficient documentation

## 2018-03-24 DIAGNOSIS — F17210 Nicotine dependence, cigarettes, uncomplicated: Secondary | ICD-10-CM

## 2018-03-24 LAB — CBC WITH DIFFERENTIAL (CANCER CENTER ONLY)
ABS IMMATURE GRANULOCYTES: 0.04 10*3/uL (ref 0.00–0.07)
BASOS PCT: 1 %
Basophils Absolute: 0.1 10*3/uL (ref 0.0–0.1)
Eosinophils Absolute: 0.5 10*3/uL (ref 0.0–0.5)
Eosinophils Relative: 6 %
HCT: 49.7 % (ref 39.0–52.0)
Hemoglobin: 16 g/dL (ref 13.0–17.0)
Immature Granulocytes: 1 %
Lymphocytes Relative: 28 %
Lymphs Abs: 2.4 10*3/uL (ref 0.7–4.0)
MCH: 25.2 pg — ABNORMAL LOW (ref 26.0–34.0)
MCHC: 32.2 g/dL (ref 30.0–36.0)
MCV: 78.4 fL — ABNORMAL LOW (ref 80.0–100.0)
Monocytes Absolute: 0.7 10*3/uL (ref 0.1–1.0)
Monocytes Relative: 8 %
NEUTROS ABS: 4.7 10*3/uL (ref 1.7–7.7)
Neutrophils Relative %: 56 %
PLATELETS: 290 10*3/uL (ref 150–400)
RBC: 6.34 MIL/uL — ABNORMAL HIGH (ref 4.22–5.81)
RDW: 16.3 % — ABNORMAL HIGH (ref 11.5–15.5)
WBC Count: 8.4 10*3/uL (ref 4.0–10.5)
nRBC: 0 % (ref 0.0–0.2)

## 2018-03-24 LAB — CMP (CANCER CENTER ONLY)
ALT: 45 U/L — ABNORMAL HIGH (ref 0–44)
AST: 29 U/L (ref 15–41)
Albumin: 4.3 g/dL (ref 3.5–5.0)
Alkaline Phosphatase: 100 U/L (ref 38–126)
Anion gap: 7 (ref 5–15)
BUN: 18 mg/dL (ref 6–20)
CO2: 27 mmol/L (ref 22–32)
Calcium: 9.4 mg/dL (ref 8.9–10.3)
Chloride: 107 mmol/L (ref 98–111)
Creatinine: 1.03 mg/dL (ref 0.61–1.24)
GFR, Est AFR Am: >60 mL/min (ref >60)
GFR, Estimated: >60 mL/min (ref >60)
Glucose, Bld: 137 mg/dL — ABNORMAL HIGH (ref 70–99)
Potassium: 4.1 mmol/L (ref 3.5–5.1)
Sodium: 141 mmol/L (ref 135–145)
Total Bilirubin: 0.8 mg/dL (ref 0.3–1.2)
Total Protein: 6.3 g/dL — ABNORMAL LOW (ref 6.5–8.1)

## 2018-03-24 NOTE — Progress Notes (Signed)
Hematology and Oncology Follow Up Visit  [place] [id number] September 25, 1985 33 y.o. 03/24/2018   Principle Diagnosis:  Secondary polycythemia- smoking  Current Therapy:   Aspirin 162 mg by mouth daily Phlebotomy to maintain hematocrit below 48%   Interim History:  [place] is here today with his wife for follow-up. He is doing well but has had some mild fatigue.  His sleep schedule is turned around and he stay up most of the night.  Hct today is 49.7%. He is still smoking 4-5 cigarettes a day.  No episodes of bleeding, no bruising or petechiae.  No fever, chills, n/v, cough, rash, dizziness, SOB, chest pain, palpitations, abdominal pain or changes in bowel or bladder habits.  No swelling, tenderness, numbness or tingling in his extremities at this time.  No lymphadenopathy noted on exam.  He has maintained a good appetite and is staying well hydrated. His weight is stable.   ECOG Performance Status: 1 - Symptomatic but completely ambulatory  Medications:  Allergies as of 03/24/2018   No Known Allergies     Medication List       Accurate as of March 24, 2018 11:06 AM. Always use your most recent med list.        aspirin 325 MG tablet Take 325 mg by mouth daily. Reported on 03/09/2015       Allergies: No Known Allergies  Past Medical History, Surgical history, Social history, and Family History were reviewed and updated.  Review of Systems: All other 10 point review of systems is negative.   Physical Exam:  weight is 228 lb 8 oz (103.6 kg). His oral temperature is 97.9 F (36.6 C). His blood pressure is 144/84 (abnormal) and his pulse is 82. His respiration is 18 and oxygen saturation is 100%.   Wt Readings from Last 3 Encounters:  03/24/18 228 lb 8 oz (103.6 kg)  02/17/18 228 lb (103.4 kg)  01/20/18 232 lb 8 oz (105.5 kg)    Ocular: Sclerae unicteric, pupils equal, round and reactive to light Ear-nose-throat: Oropharynx clear, dentition fair Lymphatic: No  cervical, supraclavicular or axillary adenopathy Lungs no rales or rhonchi, good excursion bilaterally Heart regular rate and rhythm, no murmur appreciated Abd soft, nontender, positive bowel sounds, no liver or spleen tip palpated on exam, no fluid wave  MSK no focal spinal tenderness, no joint edema Neuro: non-focal, well-oriented, appropriate affect Breasts: Deferred   Lab Results  Component Value Date   WBC 8.4 03/24/2018   HGB 16.0 03/24/2018   HCT 49.7 03/24/2018   MCV 78.4 (L) 03/24/2018   PLT 290 03/24/2018   Lab Results  Component Value Date   FERRITIN 7 (L) 02/17/2018   IRON 45 02/17/2018   TIBC 383 02/17/2018   UIBC 338 02/17/2018   IRONPCTSAT 12 (L) 02/17/2018   Lab Results  Component Value Date   RETICCTPCT 1.7 04/28/2014   RBC 6.34 (H) 03/24/2018   RETICCTABS 103.2 04/28/2014   No results found for: KPAFRELGTCHN, LAMBDASER, KAPLAMBRATIO No results found for: IGGSERUM, IGA, IGMSERUM No results found for: Odetta Pink, SPEI   Chemistry      Component Value Date/Time   NA 140 02/17/2018 0919   NA 140 12/12/2016 1420   NA 137 08/17/2015 1305   K 4.1 02/17/2018 0919   K 3.9 12/12/2016 1420   K 4.4 08/17/2015 1305   CL 105 02/17/2018 0919   CL 106 12/12/2016 1420   CO2 28 02/17/2018 0919  CO2 25 12/12/2016 1420   CO2 21 (L) 08/17/2015 1305   BUN 16 02/17/2018 0919   BUN 16 12/12/2016 1420   BUN 16.2 08/17/2015 1305   CREATININE 1.09 02/17/2018 0919   CREATININE 1.4 (H) 12/12/2016 1420   CREATININE 1.0 08/17/2015 1305      Component Value Date/Time   CALCIUM 9.0 02/17/2018 0919   CALCIUM 9.5 12/12/2016 1420   CALCIUM 9.3 08/17/2015 1305   ALKPHOS 89 02/17/2018 0919   ALKPHOS 112 (H) 12/12/2016 1420   ALKPHOS 126 08/17/2015 1305   AST 22 02/17/2018 0919   AST 25 08/17/2015 1305   ALT 38 02/17/2018 0919   ALT 48 (H) 12/12/2016 1420   ALT 40 08/17/2015 1305   BILITOT 0.5 02/17/2018 0919    BILITOT 0.56 08/17/2015 1305       Impression and Plan: [place] Poole is a pleasant 33 yo [place] gentleman with secondary polycythemia due to smoking.  Hct today is 49.7% so we will proceed with phlebotomy.  We will see him in another 6 weeks for follow-up. He plans to stop smoking.  They will contact our office with any questions or concerns. We can certainly see her sooner if need be.   Laverna Peace, NP 2/17/202011:06 AM

## 2018-03-24 NOTE — Progress Notes (Signed)
1 unit phlebotomy performed over 5 minutes using a 16 gauge phlebotomy set.via the right AC. Patient tolerated well. Nourishment provided.   Patient didn't want to stay for 30 minute post phlebotomy observation. Patient discharged ambulatory without complaints or concerns.

## 2018-03-24 NOTE — Patient Instructions (Signed)
Therapeutic Phlebotomy Therapeutic phlebotomy is the planned removal of blood from a person's body for the purpose of treating a medical condition. The procedure is similar to donating blood. Usually, about a pint (470 mL, or 0.47 L) of blood is removed. The average adult has 9-12 pints (4.3-5.7 L) of blood in the body. Therapeutic phlebotomy may be used to treat the following medical conditions:  Hemochromatosis. This is a condition in which the blood contains too much iron.  Polycythemia vera. This is a condition in which the blood contains too many red blood cells.  Porphyria cutanea tarda. This is a disease in which an important part of hemoglobin is not made properly. It results in the buildup of abnormal amounts of porphyrins in the body.  Sickle cell disease. This is a condition in which the red blood cells form an abnormal crescent shape rather than a round shape. Tell a health care provider about:  Any allergies you have.  All medicines you are taking, including vitamins, herbs, eye drops, creams, and over-the-counter medicines.  Any problems you or family members have had with anesthetic medicines.  Any blood disorders you have.  Any surgeries you have had.  Any medical conditions you have.  Whether you are pregnant or may be pregnant. What are the risks? Generally, this is a safe procedure. However, problems may occur, including:  Nausea or light-headedness.  Low blood pressure (hypotension).  Soreness, bleeding, swelling, or bruising at the needle insertion site.  Infection. What happens before the procedure?  Follow instructions from your health care provider about eating or drinking restrictions.  Ask your health care provider about: ? Changing or stopping your regular medicines. This is especially important if you are taking diabetes medicines or blood thinners (anticoagulants). ? Taking medicines such as aspirin and ibuprofen. These medicines can thin your  blood. Do not take these medicines unless your health care provider tells you to take them. ? Taking over-the-counter medicines, vitamins, herbs, and supplements.  Wear clothing with sleeves that can be raised above the elbow.  Plan to have someone take you home from the hospital or clinic.  You may have a blood sample taken.  Your blood pressure, pulse rate, and breathing rate will be measured. What happens during the procedure?   To lower your risk of infection: ? Your health care team will wash or sanitize their hands. ? Your skin will be cleaned with an antiseptic.  You may be given a medicine to numb the area (local anesthetic).  A tourniquet will be placed on your arm.  A needle will be inserted into one of your veins.  Tubing and a collection bag will be attached to that needle.  Blood will flow through the needle and tubing into the collection bag.  The collection bag will be placed lower than your arm to allow gravity to help the flow of blood into the bag.  You may be asked to open and close your hand slowly and continually during the entire collection.  After the specified amount of blood has been removed from your body, the collection bag and tubing will be clamped.  The needle will be removed from your vein.  Pressure will be held on the site of the needle insertion to stop the bleeding.  A bandage (dressing) will be placed over the needle insertion site. The procedure may vary among health care providers and hospitals. What happens after the procedure?  Your blood pressure, pulse rate, and breathing rate will be   measured after the procedure.  You will be encouraged to drink fluids.  Your recovery will be assessed and monitored.  You can return to your normal activities as told by your health care provider. Summary  Therapeutic phlebotomy is the planned removal of blood from a person's body for the purpose of treating a medical condition.  Therapeutic  phlebotomy may be used to treat hemochromatosis, polycythemia vera, porphyria cutanea tarda, or sickle cell disease.  In the procedure, a needle is inserted and about a pint (470 mL, or 0.47 L) of blood is removed. The average adult has 9-12 pints (4.3-5.7 L) of blood in the body.  This is generally a safe procedure, but it can sometimes cause problems such as nausea, light-headedness, or low blood pressure (hypotension). This information is not intended to replace advice given to you by your health care provider. Make sure you discuss any questions you have with your health care provider. Document Released: 06/26/2010 Document Revised: 02/07/2017 Document Reviewed: 02/07/2017 Elsevier Interactive Patient Education  2019 [employer]  

## 2018-05-14 ENCOUNTER — Ambulatory Visit: Payer: No Typology Code available for payment source | Admitting: Hematology & Oncology

## 2018-05-14 ENCOUNTER — Other Ambulatory Visit: Payer: No Typology Code available for payment source

## 2018-06-05 ENCOUNTER — Inpatient Hospital Stay (HOSPITAL_BASED_OUTPATIENT_CLINIC_OR_DEPARTMENT_OTHER): Payer: No Typology Code available for payment source | Admitting: Hematology & Oncology

## 2018-06-05 ENCOUNTER — Inpatient Hospital Stay: Payer: No Typology Code available for payment source | Attending: Family

## 2018-06-05 ENCOUNTER — Other Ambulatory Visit: Payer: Self-pay

## 2018-06-05 ENCOUNTER — Encounter: Payer: Self-pay | Admitting: Hematology & Oncology

## 2018-06-05 ENCOUNTER — Inpatient Hospital Stay: Payer: No Typology Code available for payment source

## 2018-06-05 VITALS — BP 130/72

## 2018-06-05 VITALS — BP 130/76 | HR 73 | Temp 98.4°F | Resp 18 | Wt 226.0 lb

## 2018-06-05 DIAGNOSIS — Z7982 Long term (current) use of aspirin: Secondary | ICD-10-CM | POA: Insufficient documentation

## 2018-06-05 DIAGNOSIS — D751 Secondary polycythemia: Secondary | ICD-10-CM

## 2018-06-05 LAB — CBC WITH DIFFERENTIAL (CANCER CENTER ONLY)
Abs Immature Granulocytes: 0.02 10*3/uL (ref 0.00–0.07)
Basophils Absolute: 0.1 10*3/uL (ref 0.0–0.1)
Basophils Relative: 1 %
Eosinophils Absolute: 0.5 10*3/uL (ref 0.0–0.5)
Eosinophils Relative: 7 %
HCT: 49.7 % (ref 39.0–52.0)
Hemoglobin: 16 g/dL (ref 13.0–17.0)
Immature Granulocytes: 0 %
Lymphocytes Relative: 36 %
Lymphs Abs: 2.7 10*3/uL (ref 0.7–4.0)
MCH: 24.5 pg — ABNORMAL LOW (ref 26.0–34.0)
MCHC: 32.2 g/dL (ref 30.0–36.0)
MCV: 76 fL — ABNORMAL LOW (ref 80.0–100.0)
Monocytes Absolute: 0.7 10*3/uL (ref 0.1–1.0)
Monocytes Relative: 10 %
Neutro Abs: 3.5 10*3/uL (ref 1.7–7.7)
Neutrophils Relative %: 46 %
Platelet Count: 279 10*3/uL (ref 150–400)
RBC: 6.54 MIL/uL — ABNORMAL HIGH (ref 4.22–5.81)
RDW: 17.1 % — ABNORMAL HIGH (ref 11.5–15.5)
WBC Count: 7.5 10*3/uL (ref 4.0–10.5)
nRBC: 0 % (ref 0.0–0.2)

## 2018-06-05 LAB — CMP (CANCER CENTER ONLY)
ALT: 42 U/L (ref 0–44)
AST: 23 U/L (ref 15–41)
Albumin: 4.3 g/dL (ref 3.5–5.0)
Alkaline Phosphatase: 96 U/L (ref 38–126)
Anion gap: 8 (ref 5–15)
BUN: 14 mg/dL (ref 6–20)
CO2: 28 mmol/L (ref 22–32)
Calcium: 10.1 mg/dL (ref 8.9–10.3)
Chloride: 105 mmol/L (ref 98–111)
Creatinine: 1.09 mg/dL (ref 0.61–1.24)
GFR, Est AFR Am: >60 mL/min (ref >60)
GFR, Estimated: >60 mL/min (ref >60)
Glucose, Bld: 98 mg/dL (ref 70–99)
Potassium: 4.8 mmol/L (ref 3.5–5.1)
Sodium: 141 mmol/L (ref 135–145)
Total Bilirubin: 0.6 mg/dL (ref 0.3–1.2)
Total Protein: 7 g/dL (ref 6.5–8.1)

## 2018-06-05 NOTE — Progress Notes (Signed)
Hematology and Oncology Follow Up Visit  [place] [id number] September 06, 1985 33 y.o. 06/05/2018   Principle Diagnosis:  Secondary polycythemia- smoking  Current Therapy:   Aspirin 162 mg by mouth daily Phlebotomy to maintain hematocrit below 48%   Interim History:  [place] is here today for follow-up.  He was last seen 2 months ago.  He has been doing pretty well.  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  He feels okay.  He still smoking 3-4 cigarettes a day.  He has had no headache.  He has had no change in bowel or bladder habits.  He is not able to work out.  Thankfully, he can work at home.  He has not gained any weight which is nice to see.  He has had no fever.  There is been no chest wall pain.  He has had no cough.  Overall, his performance status is ECOG 0.  Medications:  Allergies as of 06/05/2018   No Known Allergies     Medication List       Accurate as of June 05, 2018  2:24 PM. Always use your most recent med list.        aspirin 325 MG tablet Take 325 mg by mouth daily. Reported on 03/09/2015   omeprazole 40 MG capsule Commonly known as:  PRILOSEC Take 40 mg by mouth daily.       Allergies: No Known Allergies  Past Medical History, Surgical history, Social history, and Family History were reviewed and updated.  Review of Systems: Review of Systems  Constitutional: Negative.   HENT: Negative.   Eyes: Negative.   Respiratory: Negative.   Cardiovascular: Negative.   Gastrointestinal: Negative.   Genitourinary: Negative.   Musculoskeletal: Negative.   Skin: Negative.   Neurological: Negative.   Endo/Heme/Allergies: Negative.   Psychiatric/Behavioral: Negative.       Physical Exam:  vitals were not taken for this visit.   Wt Readings from Last 3 Encounters:  03/24/18 228 lb 8 oz (103.6 kg)  02/17/18 228 lb (103.4 kg)   01/20/18 232 lb 8 oz (105.5 kg)    Physical Exam Vitals signs reviewed.  HENT:     Head: Normocephalic and atraumatic.  Eyes:     Pupils: Pupils are equal, round, and reactive to light.  Neck:     Musculoskeletal: Normal range of motion.  Cardiovascular:     Rate and Rhythm: Normal rate and regular rhythm.     Heart sounds: Normal heart sounds.  Pulmonary:     Effort: Pulmonary effort is normal.     Breath sounds: Normal breath sounds.  Abdominal:     General: Bowel sounds are normal.     Palpations: Abdomen is soft.  Musculoskeletal: Normal range of motion.        General: No tenderness or deformity.  Lymphadenopathy:     Cervical: No cervical adenopathy.  Skin:    General: Skin is warm and dry.     Findings: No erythema or rash.  Neurological:     Mental Status: He is alert and oriented to person, place, and time.  Psychiatric:        Behavior: Behavior normal.        Thought Content: Thought content normal.        Judgment: Judgment normal.      Lab Results  Component Value Date  WBC 7.5 06/05/2018   HGB 16.0 06/05/2018   HCT 49.7 06/05/2018   MCV 76.0 (L) 06/05/2018   PLT 279 06/05/2018   Lab Results  Component Value Date   FERRITIN 7 (L) 02/17/2018   IRON 45 02/17/2018   TIBC 383 02/17/2018   UIBC 338 02/17/2018   IRONPCTSAT 12 (L) 02/17/2018   Lab Results  Component Value Date   RETICCTPCT 1.7 04/28/2014   RBC 6.54 (H) 06/05/2018   RETICCTABS 103.2 04/28/2014   No results found for: KPAFRELGTCHN, LAMBDASER, KAPLAMBRATIO No results found for: IGGSERUM, IGA, IGMSERUM No results found for: Odetta Pink, SPEI   Chemistry      Component Value Date/Time   NA 141 06/05/2018 1351   NA 140 12/12/2016 1420   NA 137 08/17/2015 1305   K 4.8 06/05/2018 1351   K 3.9 12/12/2016 1420   K 4.4 08/17/2015 1305   CL 105 06/05/2018 1351   CL 106 12/12/2016 1420   CO2 28 06/05/2018 1351   CO2 25  12/12/2016 1420   CO2 21 (L) 08/17/2015 1305   BUN 14 06/05/2018 1351   BUN 16 12/12/2016 1420   BUN 16.2 08/17/2015 1305   CREATININE 1.09 06/05/2018 1351   CREATININE 1.4 (H) 12/12/2016 1420   CREATININE 1.0 08/17/2015 1305      Component Value Date/Time   CALCIUM 10.1 06/05/2018 1351   CALCIUM 9.5 12/12/2016 1420   CALCIUM 9.3 08/17/2015 1305   ALKPHOS 96 06/05/2018 1351   ALKPHOS 112 (H) 12/12/2016 1420   ALKPHOS 126 08/17/2015 1305   AST 23 06/05/2018 1351   AST 25 08/17/2015 1305   ALT 42 06/05/2018 1351   ALT 48 (H) 12/12/2016 1420   ALT 40 08/17/2015 1305   BILITOT 0.6 06/05/2018 1351   BILITOT 0.56 08/17/2015 1305       Impression and Plan: [place] is a pleasant 33 yo [place] gentleman with secondary polycythemia due to smoking.   Hct today is 49.7% so we will proceed with phlebotomy.   I would like to think that he is iron deficient.  We last checked his iron studies back in January and his ferritin was only 7 with an iron saturation of 12%.  I think we will try to get him back in 3 months.  I think this would be reasonable.  Hopefully, with him being iron deficient, he would not be able to manage a lot of erythrocytosis.  I know that he is probably having so into financial issues right now.  I want to try to make things as easy as possible for him.    [place], MD 4/30/20202:24 PM

## 2018-06-05 NOTE — Progress Notes (Signed)
[place]ke [place] presents today for phlebotomy per MD orders. !st attempt by SCamp, RN to Rt lateral AC. 150g received before clotting.  2nd attempt phlebotomy procedure started at 1504 and ended at 80. 381 grams removed from RT medial AC.  Patient observed for 30 minutes after procedure without any incident. Patient tolerated procedure well. IV needle removed intact. Patient refused nourishment prior to and post phlebotomy d/t Ramadan. Judson Roch, NP aware prior to proceeding. dph

## 2018-06-05 NOTE — Patient Instructions (Signed)

## 2018-07-15 ENCOUNTER — Ambulatory Visit: Payer: Self-pay | Admitting: Hematology & Oncology

## 2018-07-15 ENCOUNTER — Other Ambulatory Visit: Payer: Self-pay

## 2018-09-04 ENCOUNTER — Encounter: Payer: Self-pay | Admitting: Hematology & Oncology

## 2018-09-04 ENCOUNTER — Other Ambulatory Visit: Payer: Self-pay

## 2018-09-04 ENCOUNTER — Inpatient Hospital Stay: Payer: No Typology Code available for payment source

## 2018-09-04 ENCOUNTER — Inpatient Hospital Stay
Payer: No Typology Code available for payment source | Attending: Hematology & Oncology | Admitting: Hematology & Oncology

## 2018-09-04 VITALS — BP 135/89 | HR 67 | Resp 18

## 2018-09-04 VITALS — BP 148/87 | HR 78 | Temp 97.1°F | Resp 19 | Wt 234.0 lb

## 2018-09-04 DIAGNOSIS — D751 Secondary polycythemia: Secondary | ICD-10-CM

## 2018-09-04 DIAGNOSIS — F17210 Nicotine dependence, cigarettes, uncomplicated: Secondary | ICD-10-CM | POA: Insufficient documentation

## 2018-09-04 DIAGNOSIS — Z7982 Long term (current) use of aspirin: Secondary | ICD-10-CM | POA: Insufficient documentation

## 2018-09-04 LAB — CMP (CANCER CENTER ONLY)
ALT: 34 U/L (ref 0–44)
AST: 19 U/L (ref 15–41)
Albumin: 3.9 g/dL (ref 3.5–5.0)
Alkaline Phosphatase: 110 U/L (ref 38–126)
Anion gap: 7 (ref 5–15)
BUN: 15 mg/dL (ref 6–20)
CO2: 26 mmol/L (ref 22–32)
Calcium: 8.8 mg/dL — ABNORMAL LOW (ref 8.9–10.3)
Chloride: 108 mmol/L (ref 98–111)
Creatinine: 1.02 mg/dL (ref 0.61–1.24)
GFR, Est AFR Am: >60 mL/min (ref >60)
GFR, Estimated: >60 mL/min (ref >60)
Glucose, Bld: 84 mg/dL (ref 70–99)
Potassium: 4.1 mmol/L (ref 3.5–5.1)
Sodium: 141 mmol/L (ref 135–145)
Total Bilirubin: 0.5 mg/dL (ref 0.3–1.2)
Total Protein: 6.3 g/dL — ABNORMAL LOW (ref 6.5–8.1)

## 2018-09-04 LAB — CBC WITH DIFFERENTIAL (CANCER CENTER ONLY)
Abs Immature Granulocytes: 0.05 10*3/uL (ref 0.00–0.07)
Basophils Absolute: 0 10*3/uL (ref 0.0–0.1)
Basophils Relative: 1 %
Eosinophils Absolute: 0.5 10*3/uL (ref 0.0–0.5)
Eosinophils Relative: 5 %
HCT: 52 % (ref 39.0–52.0)
Hemoglobin: 16.6 g/dL (ref 13.0–17.0)
Immature Granulocytes: 1 %
Lymphocytes Relative: 32 %
Lymphs Abs: 2.8 10*3/uL (ref 0.7–4.0)
MCH: 24.6 pg — ABNORMAL LOW (ref 26.0–34.0)
MCHC: 31.9 g/dL (ref 30.0–36.0)
MCV: 76.9 fL — ABNORMAL LOW (ref 80.0–100.0)
Monocytes Absolute: 1.1 10*3/uL — ABNORMAL HIGH (ref 0.1–1.0)
Monocytes Relative: 12 %
Neutro Abs: 4.4 10*3/uL (ref 1.7–7.7)
Neutrophils Relative %: 49 %
Platelet Count: 266 10*3/uL (ref 150–400)
RBC: 6.76 MIL/uL — ABNORMAL HIGH (ref 4.22–5.81)
RDW: 19.3 % — ABNORMAL HIGH (ref 11.5–15.5)
WBC Count: 8.8 10*3/uL (ref 4.0–10.5)
nRBC: 0 % (ref 0.0–0.2)

## 2018-09-04 LAB — IRON AND TIBC
Iron: 27 ug/dL — ABNORMAL LOW (ref 42–163)
Saturation Ratios: 6 % — ABNORMAL LOW (ref 20–55)
TIBC: 410 ug/dL — ABNORMAL HIGH (ref 202–409)
UIBC: 383 ug/dL — ABNORMAL HIGH (ref 117–376)

## 2018-09-04 LAB — FERRITIN: Ferritin: 9 ng/mL — ABNORMAL LOW (ref 24–336)

## 2018-09-04 NOTE — Progress Notes (Signed)
Hematology and Oncology Follow Up Visit  Austin Poole [id number] 03-Sep-1985 33 y.o. 09/04/2018   Principle Diagnosis:  Secondary polycythemia- smoking  Current Therapy:   Aspirin 162 mg by mouth daily Phlebotomy to maintain hematocrit below 48%   Interim History:  Austin Poole is here today for follow-up.  He was last seen 3 months ago.  He has been doing pretty well.  Despite the coronavirus, he and his family are doing okay.  They have not do stool in [place].  This Is been pretty busy.  He feels okay.  He still smoking 3-4 cigarettes a day.  He has had no headache.  He has had no change in bowel or bladder habits.  He is not able to work out.  Thankfully, he can work at home.  He has not gained any weight which is nice to see.  He has had no fever.  There is been no chest wall pain.  He has had no cough.  Overall, his performance status is ECOG 0.  Medications:  Allergies as of 09/04/2018   No Known Allergies     Medication List       Accurate as of September 04, 2018 11:55 AM. If you have any questions, ask your nurse or doctor.        aspirin 325 MG tablet Take 325 mg by mouth daily. Reported on 03/09/2015   omeprazole 40 MG capsule Commonly known as: PRILOSEC Take 40 mg by mouth daily.       Allergies: No Known Allergies  Past Medical History, Surgical history, Social history, and Family History were reviewed and updated.  Review of Systems: Review of Systems  Constitutional: Negative.   HENT: Negative.   Eyes: Negative.   Respiratory: Negative.   Cardiovascular: Negative.   Gastrointestinal: Negative.   Genitourinary: Negative.   Musculoskeletal: Negative.   Skin: Negative.   Neurological: Negative.   Endo/Heme/Allergies: Negative.   Psychiatric/Behavioral: Negative.       Physical Exam:  weight is 234 lb (106.1 kg). His temporal temperature is 97.1 F (36.2 C) (abnormal). His blood pressure is 148/87 (abnormal) and his pulse is 78. His  respiration is 19 and oxygen saturation is 100%.   Wt Readings from Last 3 Encounters:  09/04/18 234 lb (106.1 kg)  06/05/18 226 lb (102.5 kg)  03/24/18 228 lb 8 oz (103.6 kg)    Physical Exam Vitals signs reviewed.  HENT:     Head: Normocephalic and atraumatic.  Eyes:     Pupils: Pupils are equal, round, and reactive to light.  Neck:     Musculoskeletal: Normal range of motion.  Cardiovascular:     Rate and Rhythm: Normal rate and regular rhythm.     Heart sounds: Normal heart sounds.  Pulmonary:     Effort: Pulmonary effort is normal.     Breath sounds: Normal breath sounds.  Abdominal:     General: Bowel sounds are normal.     Palpations: Abdomen is soft.  Musculoskeletal: Normal range of motion.        General: No tenderness or deformity.  Lymphadenopathy:     Cervical: No cervical adenopathy.  Skin:    General: Skin is warm and dry.     Findings: No erythema or rash.  Neurological:     Mental Status: He is alert and oriented to person, place, and time.  Psychiatric:        Behavior: Behavior normal.        Thought Content: Thought content  normal.        Judgment: Judgment normal.      Lab Results  Component Value Date   WBC 8.8 09/04/2018   HGB 16.6 09/04/2018   HCT 52.0 09/04/2018   MCV 76.9 (L) 09/04/2018   PLT 266 09/04/2018   Lab Results  Component Value Date   FERRITIN 7 (L) 02/17/2018   IRON 45 02/17/2018   TIBC 383 02/17/2018   UIBC 338 02/17/2018   IRONPCTSAT 12 (L) 02/17/2018   Lab Results  Component Value Date   RETICCTPCT 1.7 04/28/2014   RBC 6.76 (H) 09/04/2018   RETICCTABS 103.2 04/28/2014   No results found for: KPAFRELGTCHN, LAMBDASER, KAPLAMBRATIO No results found for: Kandis Cocking, IGMSERUM No results found for: Odetta Pink, SPEI   Chemistry      Component Value Date/Time   NA 141 09/04/2018 1016   NA 140 12/12/2016 1420   NA 137 08/17/2015 1305   K 4.1 09/04/2018  1016   K 3.9 12/12/2016 1420   K 4.4 08/17/2015 1305   CL 108 09/04/2018 1016   CL 106 12/12/2016 1420   CO2 26 09/04/2018 1016   CO2 25 12/12/2016 1420   CO2 21 (L) 08/17/2015 1305   BUN 15 09/04/2018 1016   BUN 16 12/12/2016 1420   BUN 16.2 08/17/2015 1305   CREATININE 1.02 09/04/2018 1016   CREATININE 1.4 (H) 12/12/2016 1420   CREATININE 1.0 08/17/2015 1305      Component Value Date/Time   CALCIUM 8.8 (L) 09/04/2018 1016   CALCIUM 9.5 12/12/2016 1420   CALCIUM 9.3 08/17/2015 1305   ALKPHOS 110 09/04/2018 1016   ALKPHOS 112 (H) 12/12/2016 1420   ALKPHOS 126 08/17/2015 1305   AST 19 09/04/2018 1016   AST 25 08/17/2015 1305   ALT 34 09/04/2018 1016   ALT 48 (H) 12/12/2016 1420   ALT 40 08/17/2015 1305   BILITOT 0.5 09/04/2018 1016   BILITOT 0.56 08/17/2015 1305       Impression and Plan: Austin Poole is a pleasant 33 yo [place] gentleman with secondary polycythemia due to smoking.   We will go ahead and phlebotomize him today.  His hematocrit is up a little bit more.  I will have to think that he is iron deficient.  His MCV is on the low side.  His last iron studies back in January showed a ferritin of only 7 with iron saturation of 12%.  I will plan to get him back in another 3 months.  I think this is a good follow-up time for [place].      Volanda Napoleon, MD 7/30/202011:55 AM

## 2018-11-20 ENCOUNTER — Inpatient Hospital Stay (HOSPITAL_BASED_OUTPATIENT_CLINIC_OR_DEPARTMENT_OTHER): Payer: No Typology Code available for payment source | Admitting: Family

## 2018-11-20 ENCOUNTER — Encounter: Payer: Self-pay | Admitting: Family

## 2018-11-20 ENCOUNTER — Other Ambulatory Visit: Payer: Self-pay

## 2018-11-20 ENCOUNTER — Telehealth: Payer: Self-pay | Admitting: Family

## 2018-11-20 ENCOUNTER — Inpatient Hospital Stay: Payer: No Typology Code available for payment source

## 2018-11-20 ENCOUNTER — Inpatient Hospital Stay: Payer: No Typology Code available for payment source | Attending: Hematology & Oncology

## 2018-11-20 VITALS — BP 129/77 | HR 85 | Temp 97.3°F | Resp 16 | Wt 233.1 lb

## 2018-11-20 VITALS — BP 139/73

## 2018-11-20 DIAGNOSIS — D500 Iron deficiency anemia secondary to blood loss (chronic): Secondary | ICD-10-CM

## 2018-11-20 DIAGNOSIS — D751 Secondary polycythemia: Secondary | ICD-10-CM | POA: Insufficient documentation

## 2018-11-20 DIAGNOSIS — F17210 Nicotine dependence, cigarettes, uncomplicated: Secondary | ICD-10-CM | POA: Insufficient documentation

## 2018-11-20 DIAGNOSIS — Z7982 Long term (current) use of aspirin: Secondary | ICD-10-CM | POA: Insufficient documentation

## 2018-11-20 LAB — CBC WITH DIFFERENTIAL (CANCER CENTER ONLY)
Abs Immature Granulocytes: 0.04 10*3/uL (ref 0.00–0.07)
Basophils Absolute: 0.1 10*3/uL (ref 0.0–0.1)
Basophils Relative: 1 %
Eosinophils Absolute: 0.6 10*3/uL — ABNORMAL HIGH (ref 0.0–0.5)
Eosinophils Relative: 8 %
HCT: 52.3 % — ABNORMAL HIGH (ref 39.0–52.0)
Hemoglobin: 16.8 g/dL (ref 13.0–17.0)
Immature Granulocytes: 1 %
Lymphocytes Relative: 31 %
Lymphs Abs: 2.4 10*3/uL (ref 0.7–4.0)
MCH: 24.9 pg — ABNORMAL LOW (ref 26.0–34.0)
MCHC: 32.1 g/dL (ref 30.0–36.0)
MCV: 77.4 fL — ABNORMAL LOW (ref 80.0–100.0)
Monocytes Absolute: 0.7 10*3/uL (ref 0.1–1.0)
Monocytes Relative: 9 %
Neutro Abs: 4.2 10*3/uL (ref 1.7–7.7)
Neutrophils Relative %: 50 %
Platelet Count: 251 10*3/uL (ref 150–400)
RBC: 6.76 MIL/uL — ABNORMAL HIGH (ref 4.22–5.81)
RDW: 18.2 % — ABNORMAL HIGH (ref 11.5–15.5)
WBC Count: 8 10*3/uL (ref 4.0–10.5)
nRBC: 0 % (ref 0.0–0.2)

## 2018-11-20 LAB — IRON AND TIBC
Iron: 34 ug/dL — ABNORMAL LOW (ref 42–163)
Saturation Ratios: 9 % — ABNORMAL LOW (ref 20–55)
TIBC: 386 ug/dL (ref 202–409)
UIBC: 352 ug/dL (ref 117–376)

## 2018-11-20 LAB — CMP (CANCER CENTER ONLY)
ALT: 34 U/L (ref 0–44)
AST: 21 U/L (ref 15–41)
Albumin: 4.1 g/dL (ref 3.5–5.0)
Alkaline Phosphatase: 110 U/L (ref 38–126)
Anion gap: 7 (ref 5–15)
BUN: 13 mg/dL (ref 6–20)
CO2: 25 mmol/L (ref 22–32)
Calcium: 9.1 mg/dL (ref 8.9–10.3)
Chloride: 106 mmol/L (ref 98–111)
Creatinine: 0.93 mg/dL (ref 0.61–1.24)
GFR, Est AFR Am: >60 mL/min (ref >60)
GFR, Estimated: >60 mL/min (ref >60)
Glucose, Bld: 107 mg/dL — ABNORMAL HIGH (ref 70–99)
Potassium: 3.7 mmol/L (ref 3.5–5.1)
Sodium: 138 mmol/L (ref 135–145)
Total Bilirubin: 0.6 mg/dL (ref 0.3–1.2)
Total Protein: 6.3 g/dL — ABNORMAL LOW (ref 6.5–8.1)

## 2018-11-20 LAB — FERRITIN: Ferritin: 9 ng/mL — ABNORMAL LOW (ref 24–336)

## 2018-11-20 NOTE — Progress Notes (Signed)
Hematology and Oncology Follow Up Visit  Austin Poole [id number] Jun 04, 1985 33 y.o. 11/20/2018   Principle Diagnosis:  Secondary polycythemia- smoking  Current Therapy:   Aspirin 162 mg by mouth daily Phlebotomy to maintain hematocrit below 48%   Interim History:  Austin Poole is here today for follow-up and phlebotomy. He is doing well and has no complaints at this time.  He takes his 2 baby aspirin daily when he remembers.  His Hct today is 52.3%.  He states that he is still smoking 7-8 cigarettes a day.  He has had no fever, chills, n/v, cough, rash, dizziness, headaches, blurred/loss of vision, SOB, chest pain, palpitations, abdominal pain or changes in bowel or bladder habits.  No swelling, tenderness, numbness or tingling in his extremities.  No falls or syncope.  He has maintained a good appetite and is staying well hydrated. His weight is stable.   ECOG Performance Status: 0 - Asymptomatic  Medications:  Allergies as of 11/20/2018   No Known Allergies     Medication List       Accurate as of November 20, 2018 10:21 AM. If you have any questions, ask your nurse or doctor.        aspirin 325 MG tablet Take 325 mg by mouth daily. Reported on 03/09/2015   GaviLAX 17 GM/SCOOP powder Generic drug: polyethylene glycol powder TAKE 17 GRAMS MIXED WITH 8OZ WATER, JUICE, SODA, COFFEE OR TEA BY MOUTH ONE TIME DAILY FOR 30 DAYS   omeprazole 40 MG capsule Commonly known as: PRILOSEC Take 40 mg by mouth daily.       Allergies: No Known Allergies  Past Medical History, Surgical history, Social history, and Family History were reviewed and updated.  Review of Systems: All other 10 point review of systems is negative.   Physical Exam:  vitals were not taken for this visit.   Wt Readings from Last 3 Encounters:  09/04/18 234 lb (106.1 kg)  06/05/18 226 lb (102.5 kg)  03/24/18 228 lb 8 oz (103.6 kg)    Ocular: Sclerae unicteric, pupils equal, round and reactive to  light Ear-nose-throat: Oropharynx clear, dentition fair Lymphatic: No cervical or supraclavicular adenopathy Lungs no rales or rhonchi, good excursion bilaterally Heart regular rate and rhythm, no murmur appreciated Abd soft, nontender, positive bowel sounds, no liver or spleen tip palpated on exam, no fluid wave  MSK no focal spinal tenderness, no joint edema Neuro: non-focal, well-oriented, appropriate affect Breasts: Deferred   Lab Results  Component Value Date   WBC 8.0 11/20/2018   HGB 16.8 11/20/2018   HCT 52.3 (H) 11/20/2018   MCV 77.4 (L) 11/20/2018   PLT 251 11/20/2018   Lab Results  Component Value Date   FERRITIN 9 (L) 09/04/2018   IRON 27 (L) 09/04/2018   TIBC 410 (H) 09/04/2018   UIBC 383 (H) 09/04/2018   IRONPCTSAT 6 (L) 09/04/2018   Lab Results  Component Value Date   RETICCTPCT 1.7 04/28/2014   RBC 6.76 (H) 11/20/2018   RETICCTABS 103.2 04/28/2014   No results found for: KPAFRELGTCHN, LAMBDASER, KAPLAMBRATIO No results found for: IGGSERUM, IGA, IGMSERUM No results found for: Kathrynn Ducking, MSPIKE, SPEI   Chemistry      Component Value Date/Time   NA 141 09/04/2018 1016   NA 140 12/12/2016 1420   NA 137 08/17/2015 1305   K 4.1 09/04/2018 1016   K 3.9 12/12/2016 1420   K 4.4 08/17/2015 1305   CL 108 09/04/2018 1016  CL 106 12/12/2016 1420   CO2 26 09/04/2018 1016   CO2 25 12/12/2016 1420   CO2 21 (L) 08/17/2015 1305   BUN 15 09/04/2018 1016   BUN 16 12/12/2016 1420   BUN 16.2 08/17/2015 1305   CREATININE 1.02 09/04/2018 1016   CREATININE 1.4 (H) 12/12/2016 1420   CREATININE 1.0 08/17/2015 1305      Component Value Date/Time   CALCIUM 8.8 (L) 09/04/2018 1016   CALCIUM 9.5 12/12/2016 1420   CALCIUM 9.3 08/17/2015 1305   ALKPHOS 110 09/04/2018 1016   ALKPHOS 112 (H) 12/12/2016 1420   ALKPHOS 126 08/17/2015 1305   AST 19 09/04/2018 1016   AST 25 08/17/2015 1305   ALT 34 09/04/2018 1016   ALT 48  (H) 12/12/2016 1420   ALT 40 08/17/2015 1305   BILITOT 0.5 09/04/2018 1016   BILITOT 0.56 08/17/2015 1305       Impression and Plan: Austin Poole is a pleasant 33 yo [place] gentleman with secondary polycythemia due to smoking. 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  He continues to do well.  We will proceed with phlebotomy today as planned for Hct 52.3%.  We will plant o see him back in another 3 months.  He will contact our office with any questions or concerns. We can certainly see him sooner if needed.   Laverna Peace, NP 10/15/202010:21 AM

## 2018-11-20 NOTE — Telephone Encounter (Signed)
Appointments scheduled calendar printed per 10/15 los °

## 2019-02-20 ENCOUNTER — Ambulatory Visit: Payer: No Typology Code available for payment source | Admitting: Family

## 2019-02-20 ENCOUNTER — Other Ambulatory Visit: Payer: No Typology Code available for payment source

## 2019-02-26 ENCOUNTER — Inpatient Hospital Stay: Payer: 59 | Admitting: Family

## 2019-02-26 ENCOUNTER — Inpatient Hospital Stay: Payer: 59

## 2019-02-26 ENCOUNTER — Inpatient Hospital Stay: Payer: 59 | Attending: Family

## 2019-03-25 ENCOUNTER — Telehealth: Payer: Self-pay | Admitting: Family

## 2019-03-25 NOTE — Telephone Encounter (Signed)
Called and spoke with both wife & patient (3way call) and advised that we were r/s his 2/18 appts due to weather.  He was ok with new date/time of appointments

## 2019-03-26 ENCOUNTER — Ambulatory Visit: Payer: No Typology Code available for payment source | Admitting: Family

## 2019-03-26 ENCOUNTER — Other Ambulatory Visit: Payer: No Typology Code available for payment source

## 2019-03-30 ENCOUNTER — Encounter: Payer: Self-pay | Admitting: Family

## 2019-03-30 ENCOUNTER — Inpatient Hospital Stay (HOSPITAL_BASED_OUTPATIENT_CLINIC_OR_DEPARTMENT_OTHER): Payer: 59 | Admitting: Family

## 2019-03-30 ENCOUNTER — Inpatient Hospital Stay: Payer: 59 | Attending: Family

## 2019-03-30 ENCOUNTER — Other Ambulatory Visit: Payer: Self-pay

## 2019-03-30 ENCOUNTER — Inpatient Hospital Stay: Payer: 59

## 2019-03-30 VITALS — BP 151/88 | HR 99 | Temp 97.7°F | Resp 18 | Ht 69.0 in | Wt 239.8 lb

## 2019-03-30 VITALS — BP 141/85 | HR 100 | Resp 17

## 2019-03-30 DIAGNOSIS — F17210 Nicotine dependence, cigarettes, uncomplicated: Secondary | ICD-10-CM | POA: Diagnosis not present

## 2019-03-30 DIAGNOSIS — D751 Secondary polycythemia: Secondary | ICD-10-CM

## 2019-03-30 DIAGNOSIS — Z7982 Long term (current) use of aspirin: Secondary | ICD-10-CM | POA: Diagnosis not present

## 2019-03-30 DIAGNOSIS — Z79899 Other long term (current) drug therapy: Secondary | ICD-10-CM | POA: Diagnosis not present

## 2019-03-30 DIAGNOSIS — D500 Iron deficiency anemia secondary to blood loss (chronic): Secondary | ICD-10-CM

## 2019-03-30 LAB — CBC WITH DIFFERENTIAL (CANCER CENTER ONLY)
Abs Immature Granulocytes: 0.03 K/uL (ref 0.00–0.07)
Basophils Absolute: 0 K/uL (ref 0.0–0.1)
Basophils Relative: 0 %
Eosinophils Absolute: 0.2 K/uL (ref 0.0–0.5)
Eosinophils Relative: 3 %
HCT: 55.7 % — ABNORMAL HIGH (ref 39.0–52.0)
Hemoglobin: 18.4 g/dL — ABNORMAL HIGH (ref 13.0–17.0)
Immature Granulocytes: 0 %
Lymphocytes Relative: 26 %
Lymphs Abs: 2.2 K/uL (ref 0.7–4.0)
MCH: 25.6 pg — ABNORMAL LOW (ref 26.0–34.0)
MCHC: 33 g/dL (ref 30.0–36.0)
MCV: 77.6 fL — ABNORMAL LOW (ref 80.0–100.0)
Monocytes Absolute: 0.5 K/uL (ref 0.1–1.0)
Monocytes Relative: 6 %
Neutro Abs: 5.3 K/uL (ref 1.7–7.7)
Neutrophils Relative %: 65 %
Platelet Count: 251 K/uL (ref 150–400)
RBC: 7.18 MIL/uL — ABNORMAL HIGH (ref 4.22–5.81)
RDW: 19.5 % — ABNORMAL HIGH (ref 11.5–15.5)
WBC Count: 8.2 K/uL (ref 4.0–10.5)
nRBC: 0 % (ref 0.0–0.2)

## 2019-03-30 LAB — CMP (CANCER CENTER ONLY)
ALT: 48 U/L — ABNORMAL HIGH (ref 0–44)
AST: 27 U/L (ref 15–41)
Albumin: 4.3 g/dL (ref 3.5–5.0)
Alkaline Phosphatase: 105 U/L (ref 38–126)
Anion gap: 8 (ref 5–15)
BUN: 20 mg/dL (ref 6–20)
CO2: 26 mmol/L (ref 22–32)
Calcium: 9.8 mg/dL (ref 8.9–10.3)
Chloride: 105 mmol/L (ref 98–111)
Creatinine: 1.2 mg/dL (ref 0.61–1.24)
GFR, Est AFR Am: >60 mL/min (ref >60)
GFR, Estimated: >60 mL/min (ref >60)
Glucose, Bld: 169 mg/dL — ABNORMAL HIGH (ref 70–99)
Potassium: 4.1 mmol/L (ref 3.5–5.1)
Sodium: 139 mmol/L (ref 135–145)
Total Bilirubin: 0.7 mg/dL (ref 0.3–1.2)
Total Protein: 6.9 g/dL (ref 6.5–8.1)

## 2019-03-30 NOTE — Progress Notes (Signed)
Austin Poole presents today for phlebotomy per MD orders. Phlebotomy procedure started at 1417 and ended at 1427. 515 grams removed via 16 gauge needle to Right AC. Patient observed for 30 minutes after procedure without any incident. Pt given post procedure PO hydration. Pt declined any snacks. Patient tolerated procedure well.

## 2019-03-30 NOTE — Progress Notes (Signed)
Hematology and Oncology Follow Up Visit  Austin Poole [id number] 1985-04-17 34 y.o. 03/30/2019   Principle Diagnosis:  Secondary polycythemia- smoking  Current Therapy:   Aspirin 162 mg by mouth daily Phlebotomy to maintain hematocrit below 48%   Interim History:  Austin Poole is here today for follow-up and phlebotomy. Hct is 55.7%.  He states that he has gained a little bit of weight due to inactivity which has effected his BP.  He recently started Norvasc 5 mg PO daily.  Hct today is 55.7%. He is still smoking quite a bit.  No fever, chills, n/v, cough, rash, dizziness, SOB, chest pain, palpitations, abdominal pain or changes in bowel or bladder habits.  No swelling, tenderness, numbness or tingling in his extremities. No falls or syncope.   He is eating a healthy diet and hydrating well. His weight is stable.   ECOG Performance Status: 1 - Symptomatic but completely ambulatory  Medications:  Allergies as of 03/30/2019   No Known Allergies     Medication List       Accurate as of March 30, 2019  1:59 PM. If you have any questions, ask your nurse or doctor.        amLODipine 5 MG tablet Commonly known as: NORVASC Take 1 mg by mouth daily.   aspirin 325 MG tablet Take 325 mg by mouth daily. Reported on 03/09/2015   GaviLAX 17 GM/SCOOP powder Generic drug: polyethylene glycol powder TAKE 17 GRAMS MIXED WITH 8OZ WATER, JUICE, SODA, COFFEE OR TEA BY MOUTH ONE TIME DAILY FOR 30 DAYS   omeprazole 40 MG capsule Commonly known as: PRILOSEC Take 40 mg by mouth daily.       Allergies: No Known Allergies  Past Medical History, Surgical history, Social history, and Family History were reviewed and updated.  Review of Systems: All other 10 point review of systems is negative.   Physical Exam:  height is 5\' 9"  (1.753 m) and weight is 239 lb 12.8 oz (108.8 kg). His temporal temperature is 97.7 F (36.5 C). His blood pressure is 151/88 (abnormal) and his pulse is 99.  His respiration is 18 and oxygen saturation is 99%.   Wt Readings from Last 3 Encounters:  03/30/19 239 lb 12.8 oz (108.8 kg)  11/20/18 233 lb 1.9 oz (105.7 kg)  09/04/18 234 lb (106.1 kg)    Ocular: Sclerae unicteric, pupils equal, round and reactive to light Ear-nose-throat: Oropharynx clear, dentition fair Lymphatic: No cervical or supraclavicular adenopathy Lungs no rales or rhonchi, good excursion bilaterally Heart regular rate and rhythm, no murmur appreciated Abd soft, nontender, positive bowel sounds, no liver or spleen tip palpated on exam, no fluid wave  MSK no focal spinal tenderness, no joint edema Neuro: non-focal, well-oriented, appropriate affect Breasts: Deferred   Lab Results  Component Value Date   WBC 8.2 03/30/2019   HGB 18.4 (H) 03/30/2019   HCT 55.7 (H) 03/30/2019   MCV 77.6 (L) 03/30/2019   PLT 251 03/30/2019   Lab Results  Component Value Date   FERRITIN 9 (L) 11/20/2018   IRON 34 (L) 11/20/2018   TIBC 386 11/20/2018   UIBC 352 11/20/2018   IRONPCTSAT 9 (L) 11/20/2018   Lab Results  Component Value Date   RETICCTPCT 1.7 04/28/2014   RBC 7.18 (H) 03/30/2019   RETICCTABS 103.2 04/28/2014   No results found for: KPAFRELGTCHN, LAMBDASER, KAPLAMBRATIO No results found for: IGGSERUM, IGA, IGMSERUM No results found for: TOTALPROTELP, ALBUMINELP, A1GS, A2GS, BETS, BETA2SER, [place], MSPIKE, SPEI  Chemistry      Component Value Date/Time   NA 138 11/20/2018 1008   NA 140 12/12/2016 1420   NA 137 08/17/2015 1305   K 3.7 11/20/2018 1008   K 3.9 12/12/2016 1420   K 4.4 08/17/2015 1305   CL 106 11/20/2018 1008   CL 106 12/12/2016 1420   CO2 25 11/20/2018 1008   CO2 25 12/12/2016 1420   CO2 21 (L) 08/17/2015 1305   BUN 13 11/20/2018 1008   BUN 16 12/12/2016 1420   BUN 16.2 08/17/2015 1305   CREATININE 0.93 11/20/2018 1008   CREATININE 1.4 (H) 12/12/2016 1420   CREATININE 1.0 08/17/2015 1305      Component Value Date/Time   CALCIUM 9.1  11/20/2018 1008   CALCIUM 9.5 12/12/2016 1420   CALCIUM 9.3 08/17/2015 1305   ALKPHOS 110 11/20/2018 1008   ALKPHOS 112 (H) 12/12/2016 1420   ALKPHOS 126 08/17/2015 1305   AST 21 11/20/2018 1008   AST 25 08/17/2015 1305   ALT 34 11/20/2018 1008   ALT 48 (H) 12/12/2016 1420   ALT 40 08/17/2015 1305   BILITOT 0.6 11/20/2018 1008   BILITOT 0.56 08/17/2015 1305       Impression and Plan: Austin Poole is a pleasant 34 yo [place] gentleman with secondary polycythemia due to smoking. 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  We will proceed with phlebotomy today as planned. I did speak with dr. Marin Olp about frequency of phlebotomies for this patient and we will see him back in another 8 weeks.  He will contact our office with any questions or concerns. We can certainly see her sooner if needed.    Laverna Peace, NP 2/22/20211:59 PM

## 2019-03-30 NOTE — Patient Instructions (Signed)

## 2019-03-31 LAB — IRON AND TIBC
Iron: 59 ug/dL (ref 42–163)
Saturation Ratios: 15 % — ABNORMAL LOW (ref 20–55)
TIBC: 405 ug/dL (ref 202–409)
UIBC: 346 ug/dL (ref 117–376)

## 2019-03-31 LAB — FERRITIN: Ferritin: 8 ng/mL — ABNORMAL LOW (ref 24–336)

## 2019-05-25 ENCOUNTER — Inpatient Hospital Stay: Payer: 59

## 2019-05-25 ENCOUNTER — Inpatient Hospital Stay: Payer: 59 | Admitting: Family

## 2019-06-26 ENCOUNTER — Inpatient Hospital Stay: Payer: 59 | Admitting: Family

## 2019-06-26 ENCOUNTER — Inpatient Hospital Stay: Payer: 59

## 2019-07-09 ENCOUNTER — Inpatient Hospital Stay (HOSPITAL_BASED_OUTPATIENT_CLINIC_OR_DEPARTMENT_OTHER): Payer: 59 | Admitting: Family

## 2019-07-09 ENCOUNTER — Encounter: Payer: Self-pay | Admitting: Family

## 2019-07-09 ENCOUNTER — Inpatient Hospital Stay: Payer: 59

## 2019-07-09 ENCOUNTER — Other Ambulatory Visit: Payer: Self-pay

## 2019-07-09 ENCOUNTER — Inpatient Hospital Stay: Payer: 59 | Attending: Family

## 2019-07-09 VITALS — BP 145/95 | HR 82 | Temp 97.6°F | Resp 18 | Ht 69.0 in | Wt 235.1 lb

## 2019-07-09 DIAGNOSIS — D751 Secondary polycythemia: Secondary | ICD-10-CM

## 2019-07-09 DIAGNOSIS — D500 Iron deficiency anemia secondary to blood loss (chronic): Secondary | ICD-10-CM

## 2019-07-09 DIAGNOSIS — Z7982 Long term (current) use of aspirin: Secondary | ICD-10-CM | POA: Insufficient documentation

## 2019-07-09 DIAGNOSIS — R5383 Other fatigue: Secondary | ICD-10-CM | POA: Insufficient documentation

## 2019-07-09 DIAGNOSIS — F17210 Nicotine dependence, cigarettes, uncomplicated: Secondary | ICD-10-CM | POA: Diagnosis not present

## 2019-07-09 LAB — CBC WITH DIFFERENTIAL (CANCER CENTER ONLY)
Abs Immature Granulocytes: 0.04 10*3/uL (ref 0.00–0.07)
Basophils Absolute: 0 10*3/uL (ref 0.0–0.1)
Basophils Relative: 1 %
Eosinophils Absolute: 0.4 10*3/uL (ref 0.0–0.5)
Eosinophils Relative: 5 %
HCT: 55 % — ABNORMAL HIGH (ref 39.0–52.0)
Hemoglobin: 18.1 g/dL — ABNORMAL HIGH (ref 13.0–17.0)
Immature Granulocytes: 1 %
Lymphocytes Relative: 35 %
Lymphs Abs: 2.9 10*3/uL (ref 0.7–4.0)
MCH: 26.6 pg (ref 26.0–34.0)
MCHC: 32.9 g/dL (ref 30.0–36.0)
MCV: 80.9 fL (ref 80.0–100.0)
Monocytes Absolute: 0.7 10*3/uL (ref 0.1–1.0)
Monocytes Relative: 8 %
Neutro Abs: 4.2 10*3/uL (ref 1.7–7.7)
Neutrophils Relative %: 50 %
Platelet Count: 228 10*3/uL (ref 150–400)
RBC: 6.8 MIL/uL — ABNORMAL HIGH (ref 4.22–5.81)
RDW: 18 % — ABNORMAL HIGH (ref 11.5–15.5)
WBC Count: 8.2 10*3/uL (ref 4.0–10.5)
nRBC: 0 % (ref 0.0–0.2)

## 2019-07-09 LAB — CMP (CANCER CENTER ONLY)
ALT: 46 U/L — ABNORMAL HIGH (ref 0–44)
AST: 29 U/L (ref 15–41)
Albumin: 4.1 g/dL (ref 3.5–5.0)
Alkaline Phosphatase: 105 U/L (ref 38–126)
Anion gap: 7 (ref 5–15)
BUN: 13 mg/dL (ref 6–20)
CO2: 26 mmol/L (ref 22–32)
Calcium: 9.6 mg/dL (ref 8.9–10.3)
Chloride: 105 mmol/L (ref 98–111)
Creatinine: 1.06 mg/dL (ref 0.61–1.24)
GFR, Est AFR Am: >60 mL/min (ref >60)
GFR, Estimated: >60 mL/min (ref >60)
Glucose, Bld: 115 mg/dL — ABNORMAL HIGH (ref 70–99)
Potassium: 4.2 mmol/L (ref 3.5–5.1)
Sodium: 138 mmol/L (ref 135–145)
Total Bilirubin: 1.2 mg/dL (ref 0.3–1.2)
Total Protein: 6.8 g/dL (ref 6.5–8.1)

## 2019-07-09 NOTE — Progress Notes (Signed)
Hematology and Oncology Follow Up Visit  Hossam Deflorio [id number] [birth year] 34 y.o. 07/09/2019   Principle Diagnosis:  Secondary polycythemia- smoking  Current Therapy:        Aspirin 162 mg by mouth daily Phlebotomy to maintain hematocrit below 48%   Interim History:  Mr. Mundine is here today for follow-up and phlebotomy. He is doing well but has some occasional fatigue.  He started going back to the gym recently.  Hct today is 55%.  No episodes of bleeding. No bruising or petechiae.  He states that he takes his Aspirin daily when he remembers.  No fever, chills, n/v, cough, rash, dizziness, SOB, chest pain, palpitations, abdominal pain or changes in bowel or bladder habits.  No swelling, tenderness, numbness or tingling in his extremities at this time.  No falls or syncope.  He is eating well and staying hydrated. His weight is stable.   ECOG Performance Status: 1 - Symptomatic but completely ambulatory  Medications:  Allergies as of 07/09/2019   No Known Allergies     Medication List       Accurate as of July 09, 2019 11:59 AM. If you have any questions, ask your nurse or doctor.        amLODipine 5 MG tablet Commonly known as: NORVASC Take 1 mg by mouth daily.   aspirin 325 MG tablet Take 325 mg by mouth daily. Reported on 03/09/2015   GaviLAX 17 GM/SCOOP powder Generic drug: polyethylene glycol powder TAKE 17 GRAMS MIXED WITH 8OZ WATER, JUICE, SODA, COFFEE OR TEA BY MOUTH ONE TIME DAILY FOR 30 DAYS   omeprazole 40 MG capsule Commonly known as: PRILOSEC Take 40 mg by mouth daily.       Allergies: No Known Allergies  Past Medical History, Surgical history, Social history, and Family History were reviewed and updated.  Review of Systems: All other 10 point review of systems is negative.   Physical Exam:  vitals were not taken for this visit.   Wt Readings from Last 3 Encounters:  03/30/19 239 lb 12.8 oz (108.8 kg)  11/20/18 233 lb 1.9 oz (105.7 kg)    09/04/18 234 lb (106.1 kg)    Ocular: Sclerae unicteric, pupils equal, round and reactive to light Ear-nose-throat: Oropharynx clear, dentition fair Lymphatic: No cervical or supraclavicular adenopathy Lungs no rales or rhonchi, good excursion bilaterally Heart regular rate and rhythm, no murmur appreciated Abd soft, nontender, positive bowel sounds, no liver or spleen tip palpated on exam, no fluid wave  MSK no focal spinal tenderness, no joint edema Neuro: non-focal, well-oriented, appropriate affect Breasts: Deferred   Lab Results  Component Value Date   WBC 8.2 07/09/2019   HGB 18.1 (H) 07/09/2019   HCT 55.0 (H) 07/09/2019   MCV 80.9 07/09/2019   PLT 228 07/09/2019   Lab Results  Component Value Date   FERRITIN 8 (L) 03/30/2019   IRON 59 03/30/2019   TIBC 405 03/30/2019   UIBC 346 03/30/2019   IRONPCTSAT 15 (L) 03/30/2019   Lab Results  Component Value Date   RETICCTPCT 1.7 04/28/2014   RBC 6.80 (H) 07/09/2019   RETICCTABS 103.2 04/28/2014   No results found for: KPAFRELGTCHN, LAMBDASER, KAPLAMBRATIO No results found for: IGGSERUM, IGA, IGMSERUM No results found for: TOTALPROTELP, ALBUMINELP, A1GS, A2GS, [name], MSPIKE, SPEI   Chemistry      Component Value Date/Time   NA 138 07/09/2019 1108   NA 140 12/12/2016 1420   NA 137 08/17/2015 1305   K 4.2  07/09/2019 1108   K 3.9 12/12/2016 1420   K 4.4 08/17/2015 1305   CL 105 07/09/2019 1108   CL 106 12/12/2016 1420   CO2 26 07/09/2019 1108   CO2 25 12/12/2016 1420   CO2 21 (L) 08/17/2015 1305   BUN 13 07/09/2019 1108   BUN 16 12/12/2016 1420   BUN 16.2 08/17/2015 1305   CREATININE 1.06 07/09/2019 1108   CREATININE 1.4 (H) 12/12/2016 1420   CREATININE 1.0 08/17/2015 1305      Component Value Date/Time   CALCIUM 9.6 07/09/2019 1108   CALCIUM 9.5 12/12/2016 1420   CALCIUM 9.3 08/17/2015 1305   ALKPHOS 105 07/09/2019 1108   ALKPHOS 112 (H) 12/12/2016 1420   ALKPHOS 126 08/17/2015 1305   AST  29 07/09/2019 1108   AST 25 08/17/2015 1305   ALT 46 (H) 07/09/2019 1108   ALT 48 (H) 12/12/2016 1420   ALT 40 08/17/2015 1305   BILITOT 1.2 07/09/2019 1108   BILITOT 0.56 08/17/2015 1305       Impression and Plan: Mr. 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  He had phlebotomy today for Hct 55%.  We will see him again in another 8 weeks.  He will contact our office with any questions or concerns. We can certainly see her sooner if needed.    Laverna Peace, NP 6/3/202111:59 AM

## 2019-07-09 NOTE — Progress Notes (Signed)
Austin Poole presents today for phlebotomy per MD orders. Phlebotomy procedure started at 1220 and ended at 1230. 533 grams removed. Patient observed for 30 minutes after procedure without any incident. Patient tolerated procedure well. IV needle removed intact.

## 2019-07-09 NOTE — Patient Instructions (Signed)
Therapeutic Phlebotomy Therapeutic phlebotomy is the planned removal of blood from a person's body for the purpose of treating a medical condition. The procedure is similar to donating blood. Usually, about a pint (470 mL, or 0.47 L) of blood is removed. The average adult has 9-12 pints (4.3-5.7 L) of blood in the body. Therapeutic phlebotomy may be used to treat the following medical conditions:  Hemochromatosis. This is a condition in which the blood contains too much iron.  Polycythemia vera. This is a condition in which the blood contains too many red blood cells.  Porphyria cutanea tarda. This is a disease in which an important part of hemoglobin is not made properly. It results in the buildup of abnormal amounts of porphyrins in the body.  Sickle cell disease. This is a condition in which the red blood cells form an abnormal crescent shape rather than a round shape. Tell a health care provider about:  Any allergies you have.  All medicines you are taking, including vitamins, herbs, eye drops, creams, and over-the-counter medicines.  Any problems you or family members have had with anesthetic medicines.  Any blood disorders you have.  Any surgeries you have had.  Any medical conditions you have.  Whether you are pregnant or may be pregnant. What are the risks? Generally, this is a safe procedure. However, problems may occur, including:  Nausea or light-headedness.  Low blood pressure (hypotension).  Soreness, bleeding, swelling, or bruising at the needle insertion site.  Infection. What happens before the procedure?  Follow instructions from your health care provider about eating or drinking restrictions.  Ask your health care provider about: ? Changing or stopping your regular medicines. This is especially important if you are taking diabetes medicines or blood thinners (anticoagulants). ? Taking medicines such as aspirin and ibuprofen. These medicines can thin your  blood. Do not take these medicines unless your health care provider tells you to take them. ? Taking over-the-counter medicines, vitamins, herbs, and supplements.  Wear clothing with sleeves that can be raised above the elbow.  Plan to have someone take you home from the hospital or clinic.  You may have a blood sample taken.  Your blood pressure, pulse rate, and breathing rate will be measured. What happens during the procedure?   To lower your risk of infection: ? Your health care team will wash or sanitize their hands. ? Your skin will be cleaned with an antiseptic.  You may be given a medicine to numb the area (local anesthetic).  A tourniquet will be placed on your arm.  A needle will be inserted into one of your veins.  Tubing and a collection bag will be attached to that needle.  Blood will flow through the needle and tubing into the collection bag.  The collection bag will be placed lower than your arm to allow gravity to help the flow of blood into the bag.  You may be asked to open and close your hand slowly and continually during the entire collection.  After the specified amount of blood has been removed from your body, the collection bag and tubing will be clamped.  The needle will be removed from your vein.  Pressure will be held on the site of the needle insertion to stop the bleeding.  A bandage (dressing) will be placed over the needle insertion site. The procedure may vary among health care providers and hospitals. What happens after the procedure?  Your blood pressure, pulse rate, and breathing rate will be   measured after the procedure.  You will be encouraged to drink fluids.  Your recovery will be assessed and monitored.  You can return to your normal activities as told by your health care provider. Summary  Therapeutic phlebotomy is the planned removal of blood from a person's body for the purpose of treating a medical condition.  Therapeutic  phlebotomy may be used to treat hemochromatosis, polycythemia vera, porphyria cutanea tarda, or sickle cell disease.  In the procedure, a needle is inserted and about a pint (470 mL, or 0.47 L) of blood is removed. The average adult has 9-12 pints (4.3-5.7 L) of blood in the body.  This is generally a safe procedure, but it can sometimes cause problems such as nausea, light-headedness, or low blood pressure (hypotension). This information is not intended to replace advice given to you by your health care provider. Make sure you discuss any questions you have with your health care provider. Document Revised: 02/07/2017 Document Reviewed: 02/07/2017 Elsevier Patient Education  2020 [employer] Inc[employer]  

## 2019-07-10 LAB — FERRITIN: Ferritin: 15 ng/mL — ABNORMAL LOW (ref 24–336)

## 2019-07-10 LAB — IRON AND TIBC
Iron: 134 ug/dL (ref 42–163)
Saturation Ratios: 34 % (ref 20–55)
TIBC: 396 ug/dL (ref 202–409)
UIBC: 262 ug/dL (ref 117–376)

## 2019-09-08 ENCOUNTER — Inpatient Hospital Stay (HOSPITAL_BASED_OUTPATIENT_CLINIC_OR_DEPARTMENT_OTHER): Payer: 59 | Admitting: Family

## 2019-09-08 ENCOUNTER — Encounter: Payer: Self-pay | Admitting: Family

## 2019-09-08 ENCOUNTER — Inpatient Hospital Stay: Payer: 59 | Attending: Family

## 2019-09-08 ENCOUNTER — Other Ambulatory Visit: Payer: Self-pay

## 2019-09-08 ENCOUNTER — Inpatient Hospital Stay: Payer: 59

## 2019-09-08 VITALS — BP 124/87 | HR 79 | Resp 18

## 2019-09-08 VITALS — BP 129/81 | HR 76 | Temp 98.5°F | Resp 18 | Ht 69.0 in | Wt 231.0 lb

## 2019-09-08 DIAGNOSIS — F17210 Nicotine dependence, cigarettes, uncomplicated: Secondary | ICD-10-CM | POA: Diagnosis not present

## 2019-09-08 DIAGNOSIS — D751 Secondary polycythemia: Secondary | ICD-10-CM | POA: Insufficient documentation

## 2019-09-08 DIAGNOSIS — D500 Iron deficiency anemia secondary to blood loss (chronic): Secondary | ICD-10-CM

## 2019-09-08 LAB — CBC WITH DIFFERENTIAL (CANCER CENTER ONLY)
Abs Immature Granulocytes: 0.03 10*3/uL (ref 0.00–0.07)
Basophils Absolute: 0 10*3/uL (ref 0.0–0.1)
Basophils Relative: 1 %
Eosinophils Absolute: 0.5 10*3/uL (ref 0.0–0.5)
Eosinophils Relative: 6 %
HCT: 54.5 % — ABNORMAL HIGH (ref 39.0–52.0)
Hemoglobin: 18 g/dL — ABNORMAL HIGH (ref 13.0–17.0)
Immature Granulocytes: 0 %
Lymphocytes Relative: 31 %
Lymphs Abs: 2.4 10*3/uL (ref 0.7–4.0)
MCH: 26.9 pg (ref 26.0–34.0)
MCHC: 33 g/dL (ref 30.0–36.0)
MCV: 81.3 fL (ref 80.0–100.0)
Monocytes Absolute: 0.7 10*3/uL (ref 0.1–1.0)
Monocytes Relative: 9 %
Neutro Abs: 4.2 10*3/uL (ref 1.7–7.7)
Neutrophils Relative %: 53 %
Platelet Count: 227 10*3/uL (ref 150–400)
RBC: 6.7 MIL/uL — ABNORMAL HIGH (ref 4.22–5.81)
RDW: 17.3 % — ABNORMAL HIGH (ref 11.5–15.5)
WBC Count: 7.8 10*3/uL (ref 4.0–10.5)
nRBC: 0 % (ref 0.0–0.2)

## 2019-09-08 LAB — CMP (CANCER CENTER ONLY)
ALT: 43 U/L (ref 0–44)
AST: 27 U/L (ref 15–41)
Albumin: 4.2 g/dL (ref 3.5–5.0)
Alkaline Phosphatase: 97 U/L (ref 38–126)
Anion gap: 8 (ref 5–15)
BUN: 14 mg/dL (ref 6–20)
CO2: 23 mmol/L (ref 22–32)
Calcium: 9.8 mg/dL (ref 8.9–10.3)
Chloride: 107 mmol/L (ref 98–111)
Creatinine: 0.93 mg/dL (ref 0.61–1.24)
GFR, Est AFR Am: >60 mL/min (ref >60)
GFR, Estimated: >60 mL/min (ref >60)
Glucose, Bld: 102 mg/dL — ABNORMAL HIGH (ref 70–99)
Potassium: 4.1 mmol/L (ref 3.5–5.1)
Sodium: 138 mmol/L (ref 135–145)
Total Bilirubin: 0.8 mg/dL (ref 0.3–1.2)
Total Protein: 6.8 g/dL (ref 6.5–8.1)

## 2019-09-08 NOTE — Progress Notes (Signed)
Hematology and Oncology Follow Up Visit  Austin Poole [id number] 02/28/1985 34 y.o. 09/08/2019   Principle Diagnosis:  Secondary polycythemia- smoking  Current Therapy: Aspirin 162 mg by mouth daily Phlebotomy to maintain hematocrit below 48%   Interim History:  Austin Poole is here today for follow-up. He is doing well but has noted that the aspirin bothers his stomach at times. He will try taking enteric coated baby aspirin with food.  No episodes of bleeding. No bruising or petechiae.  Hct is 54.5.  He is still smoking 3-4 cigarettes a day.  No fever, chills, n/v, cough, rash, dizziness, SOB, chest pain, palpitations, abdominal pain or changes in bowel or bladder habits. No swelling, tenderness, numbness or tingling in her extremities.  No falls or syncope.  She has maintained a good appetite and is staying well hydrated. His weight is stable.   ECOG Performance Status: 1 - Symptomatic but completely ambulatory  Medications:  Allergies as of 09/08/2019   No Known Allergies     Medication List       Accurate as of September 08, 2019 12:48 PM. If you have any questions, ask your nurse or doctor.        amLODipine 10 MG tablet Commonly known as: NORVASC Take 10 mg by mouth daily. What changed: Another medication with the same name was removed. Continue taking this medication, and follow the directions you see here. Changed by: Laverna Peace, NP   aspirin 325 MG tablet Take 325 mg by mouth daily. Reported on 03/09/2015   GaviLAX 17 GM/SCOOP powder Generic drug: polyethylene glycol powder TAKE 17 GRAMS MIXED WITH 8OZ WATER, JUICE, SODA, COFFEE OR TEA BY MOUTH ONE TIME DAILY FOR 30 DAYS   omeprazole 40 MG capsule Commonly known as: PRILOSEC Take 40 mg by mouth daily.       Allergies: No Known Allergies  Past Medical History, Surgical history, Social history, and Family History were reviewed and updated.  Review of Systems: All other 10 point review of systems  is negative.   Physical Exam:  height is 5\' 9"  (1.753 m) and weight is 231 lb (104.8 kg). His oral temperature is 98.5 F (36.9 C). His blood pressure is 129/81 and his pulse is 76. His respiration is 18 and oxygen saturation is 100%.   Wt Readings from Last 3 Encounters:  09/08/19 231 lb (104.8 kg)  07/09/19 235 lb 0.8 oz (106.6 kg)  03/30/19 239 lb 12.8 oz (108.8 kg)    Ocular: Sclerae unicteric, pupils equal, round and reactive to light Ear-nose-throat: Oropharynx clear, dentition fair Lymphatic: No cervical or supraclavicular adenopathy Lungs no rales or rhonchi, good excursion bilaterally Heart regular rate and rhythm, no murmur appreciated Abd soft, nontender, positive bowel sounds, no liver or spleen tip palpated on exam, no fluid wave  MSK no focal spinal tenderness, no joint edema Neuro: non-focal, well-oriented, appropriate affect Breasts: Deferred   Lab Results  Component Value Date   WBC 7.8 09/08/2019   HGB 18.0 (H) 09/08/2019   HCT 54.5 (H) 09/08/2019   MCV 81.3 09/08/2019   PLT 227 09/08/2019   Lab Results  Component Value Date   FERRITIN 15 (L) 07/09/2019   IRON 134 07/09/2019   TIBC 396 07/09/2019   UIBC 262 07/09/2019   IRONPCTSAT 34 07/09/2019   Lab Results  Component Value Date   RETICCTPCT 1.7 04/28/2014   RBC 6.70 (H) 09/08/2019   RETICCTABS 103.2 04/28/2014   No results found for: KPAFRELGTCHN, LAMBDASER, KAPLAMBRATIO No results  found for: Kandis Cocking, IGMSERUM No results found for: Odetta Pink, SPEI   Chemistry      Component Value Date/Time   NA 138 09/08/2019 1057   NA 140 12/12/2016 1420   NA 137 08/17/2015 1305   K 4.1 09/08/2019 1057   K 3.9 12/12/2016 1420   K 4.4 08/17/2015 1305   CL 107 09/08/2019 1057   CL 106 12/12/2016 1420   CO2 23 09/08/2019 1057   CO2 25 12/12/2016 1420   CO2 21 (L) 08/17/2015 1305   BUN 14 09/08/2019 1057   BUN 16 12/12/2016 1420   BUN 16.2  08/17/2015 1305   CREATININE 0.93 09/08/2019 1057   CREATININE 1.4 (H) 12/12/2016 1420   CREATININE 1.0 08/17/2015 1305      Component Value Date/Time   CALCIUM 9.8 09/08/2019 1057   CALCIUM 9.5 12/12/2016 1420   CALCIUM 9.3 08/17/2015 1305   ALKPHOS 97 09/08/2019 1057   ALKPHOS 112 (H) 12/12/2016 1420   ALKPHOS 126 08/17/2015 1305   AST 27 09/08/2019 1057   AST 25 08/17/2015 1305   ALT 43 09/08/2019 1057   ALT 48 (H) 12/12/2016 1420   ALT 40 08/17/2015 1305   BILITOT 0.8 09/08/2019 1057   BILITOT 0.56 08/17/2015 1305       Impression and Plan: Austin Poole is a pleasant 34yo [place] gentleman with secondary polycythemia due to smoking. 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 We will proceed with phlebotomy if needed.  Follow-up in 2 months.  He can contact our office with any questions or concerns.   Laverna Peace, NP 8/3/202112:48 PM

## 2019-09-08 NOTE — Patient Instructions (Signed)
Therapeutic Phlebotomy Therapeutic phlebotomy is the planned removal of blood from a person's body for the purpose of treating a medical condition. The procedure is similar to donating blood. Usually, about a pint (470 mL, or 0.47 L) of blood is removed. The average adult has 9-12 pints (4.3-5.7 L) of blood in the body. Therapeutic phlebotomy may be used to treat the following medical conditions:  Hemochromatosis. This is a condition in which the blood contains too much iron.  Polycythemia vera. This is a condition in which the blood contains too many red blood cells.  Porphyria cutanea tarda. This is a disease in which an important part of hemoglobin is not made properly. It results in the buildup of abnormal amounts of porphyrins in the body.  Sickle cell disease. This is a condition in which the red blood cells form an abnormal crescent shape rather than a round shape. Tell a health care provider about:  Any allergies you have.  All medicines you are taking, including vitamins, herbs, eye drops, creams, and over-the-counter medicines.  Any problems you or family members have had with anesthetic medicines.  Any blood disorders you have.  Any surgeries you have had.  Any medical conditions you have.  Whether you are pregnant or may be pregnant. What are the risks? Generally, this is a safe procedure. However, problems may occur, including:  Nausea or light-headedness.  Low blood pressure (hypotension).  Soreness, bleeding, swelling, or bruising at the needle insertion site.  Infection. What happens before the procedure?  Follow instructions from your health care provider about eating or drinking restrictions.  Ask your health care provider about: ? Changing or stopping your regular medicines. This is especially important if you are taking diabetes medicines or blood thinners (anticoagulants). ? Taking medicines such as aspirin and ibuprofen. These medicines can thin your  blood. Do not take these medicines unless your health care provider tells you to take them. ? Taking over-the-counter medicines, vitamins, herbs, and supplements.  Wear clothing with sleeves that can be raised above the elbow.  Plan to have someone take you home from the hospital or clinic.  You may have a blood sample taken.  Your blood pressure, pulse rate, and breathing rate will be measured. What happens during the procedure?   To lower your risk of infection: ? Your health care team will wash or sanitize their hands. ? Your skin will be cleaned with an antiseptic.  You may be given a medicine to numb the area (local anesthetic).  A tourniquet will be placed on your arm.  A needle will be inserted into one of your veins.  Tubing and a collection bag will be attached to that needle.  Blood will flow through the needle and tubing into the collection bag.  The collection bag will be placed lower than your arm to allow gravity to help the flow of blood into the bag.  You may be asked to open and close your hand slowly and continually during the entire collection.  After the specified amount of blood has been removed from your body, the collection bag and tubing will be clamped.  The needle will be removed from your vein.  Pressure will be held on the site of the needle insertion to stop the bleeding.  A bandage (dressing) will be placed over the needle insertion site. The procedure may vary among health care providers and hospitals. What happens after the procedure?  Your blood pressure, pulse rate, and breathing rate will be   measured after the procedure.  You will be encouraged to drink fluids.  Your recovery will be assessed and monitored.  You can return to your normal activities as told by your health care provider. Summary  Therapeutic phlebotomy is the planned removal of blood from a person's body for the purpose of treating a medical condition.  Therapeutic  phlebotomy may be used to treat hemochromatosis, polycythemia vera, porphyria cutanea tarda, or sickle cell disease.  In the procedure, a needle is inserted and about a pint (470 mL, or 0.47 L) of blood is removed. The average adult has 9-12 pints (4.3-5.7 L) of blood in the body.  This is generally a safe procedure, but it can sometimes cause problems such as nausea, light-headedness, or low blood pressure (hypotension). This information is not intended to replace advice given to you by your health care provider. Make sure you discuss any questions you have with your health care provider. Document Revised: 02/07/2017 Document Reviewed: 02/07/2017 Elsevier Patient Education  2020 [employer] Inc[employer]  

## 2019-09-08 NOTE — Progress Notes (Signed)
Austin Poole presents today for phlebotomy per MD orders. Phlebotomy procedure started at 1135 and ended at 1142. 520 cc removed via 16 G needle at L antecubital site. Patient tolerated procedure well.

## 2019-09-09 LAB — FERRITIN: Ferritin: 16 ng/mL — ABNORMAL LOW (ref 24–336)

## 2019-09-09 LAB — IRON AND TIBC
Iron: 79 ug/dL (ref 42–163)
Saturation Ratios: 20 % (ref 20–55)
TIBC: 394 ug/dL (ref 202–409)
UIBC: 315 ug/dL (ref 117–376)

## 2019-12-08 ENCOUNTER — Inpatient Hospital Stay: Payer: 59

## 2019-12-08 ENCOUNTER — Telehealth: Payer: Self-pay | Admitting: Family

## 2019-12-08 ENCOUNTER — Encounter: Payer: Self-pay | Admitting: Family

## 2019-12-08 ENCOUNTER — Inpatient Hospital Stay: Payer: 59 | Attending: Family | Admitting: Family

## 2019-12-08 ENCOUNTER — Other Ambulatory Visit: Payer: Self-pay

## 2019-12-08 VITALS — BP 140/88 | HR 75 | Temp 98.2°F | Resp 18 | Ht 69.0 in | Wt 231.1 lb

## 2019-12-08 DIAGNOSIS — F17210 Nicotine dependence, cigarettes, uncomplicated: Secondary | ICD-10-CM | POA: Diagnosis not present

## 2019-12-08 DIAGNOSIS — D500 Iron deficiency anemia secondary to blood loss (chronic): Secondary | ICD-10-CM

## 2019-12-08 DIAGNOSIS — Z7982 Long term (current) use of aspirin: Secondary | ICD-10-CM | POA: Insufficient documentation

## 2019-12-08 DIAGNOSIS — D751 Secondary polycythemia: Secondary | ICD-10-CM

## 2019-12-08 LAB — CBC WITH DIFFERENTIAL (CANCER CENTER ONLY)
Abs Immature Granulocytes: 0.04 10*3/uL (ref 0.00–0.07)
Basophils Absolute: 0 10*3/uL (ref 0.0–0.1)
Basophils Relative: 1 %
Eosinophils Absolute: 0.5 10*3/uL (ref 0.0–0.5)
Eosinophils Relative: 7 %
HCT: 53.1 % — ABNORMAL HIGH (ref 39.0–52.0)
Hemoglobin: 17.6 g/dL — ABNORMAL HIGH (ref 13.0–17.0)
Immature Granulocytes: 1 %
Lymphocytes Relative: 30 %
Lymphs Abs: 2.2 10*3/uL (ref 0.7–4.0)
MCH: 27.8 pg (ref 26.0–34.0)
MCHC: 33.1 g/dL (ref 30.0–36.0)
MCV: 83.8 fL (ref 80.0–100.0)
Monocytes Absolute: 0.9 10*3/uL (ref 0.1–1.0)
Monocytes Relative: 12 %
Neutro Abs: 3.6 10*3/uL (ref 1.7–7.7)
Neutrophils Relative %: 49 %
Platelet Count: 215 10*3/uL (ref 150–400)
RBC: 6.34 MIL/uL — ABNORMAL HIGH (ref 4.22–5.81)
RDW: 16.7 % — ABNORMAL HIGH (ref 11.5–15.5)
WBC Count: 7.2 10*3/uL (ref 4.0–10.5)
nRBC: 0 % (ref 0.0–0.2)

## 2019-12-08 LAB — CMP (CANCER CENTER ONLY)
ALT: 34 U/L (ref 0–44)
AST: 25 U/L (ref 15–41)
Albumin: 3.9 g/dL (ref 3.5–5.0)
Alkaline Phosphatase: 94 U/L (ref 38–126)
Anion gap: 7 (ref 5–15)
BUN: 15 mg/dL (ref 6–20)
CO2: 27 mmol/L (ref 22–32)
Calcium: 9.5 mg/dL (ref 8.9–10.3)
Chloride: 107 mmol/L (ref 98–111)
Creatinine: 1.02 mg/dL (ref 0.61–1.24)
GFR, Estimated: >60 mL/min (ref >60)
Glucose, Bld: 68 mg/dL — ABNORMAL LOW (ref 70–99)
Potassium: 4.3 mmol/L (ref 3.5–5.1)
Sodium: 141 mmol/L (ref 135–145)
Total Bilirubin: 0.5 mg/dL (ref 0.3–1.2)
Total Protein: 6.4 g/dL — ABNORMAL LOW (ref 6.5–8.1)

## 2019-12-08 NOTE — Progress Notes (Signed)
Austin Poole presents today for phlebotomy per MD orders. Phlebotomy procedure started at1117 and ended at 1123  547grams removed. Patient observed for 30 minutes after procedure without any incident. Patient tolerated procedure well. IV needle removed intact.

## 2019-12-08 NOTE — Telephone Encounter (Signed)
Appointments scheduled calendar printed per 1/12 los 

## 2019-12-08 NOTE — Progress Notes (Signed)
Hematology and Oncology Follow Up Visit  Austin Poole [id number] 06/23/1985 34 y.o. 12/08/2019   Principle Diagnosis:  Secondary polycythemia- smoking  Current Therapy: Aspirin 162 mg by mouth daily Phlebotomy to maintain hematocrit below 48%   Interim History:  Austin Poole is here today for follow-up and phlebotomy. He is doing well and is back to working out at the gym 6-7 days a week.  He denies fatigue.  He is still smoking 7-8 cigarettes a day.  Hgb is 17.6, Hct 53%, platelets 215 and WBC count 7.2.  No fever, chills, n/v, cough, rash, dizziness, SOB, chest pain, palpitations, abdominal pain or changes in bowel or bladder habits.  No blood loss noted. No abnormal bruising or petechiae.  No swelling, tenderness, numbness or tingling in his extremities.  No falls or syncope.  He has maintained a good appetite and is eating a healthier diet. He states that he is staying well hydrated. His weight is stable.   ECOG Performance Status: 1 - Symptomatic but completely ambulatory  Medications:  Allergies as of 12/08/2019   No Known Allergies     Medication List       Accurate as of December 08, 2019 10:52 AM. If you have any questions, ask your nurse or doctor.        amLODipine 10 MG tablet Commonly known as: NORVASC Take 10 mg by mouth daily.   aspirin 325 MG tablet Take 325 mg by mouth daily. Reported on 03/09/2015   GaviLAX 17 GM/SCOOP powder Generic drug: polyethylene glycol powder TAKE 17 GRAMS MIXED WITH 8OZ WATER, JUICE, SODA, COFFEE OR TEA BY MOUTH ONE TIME DAILY FOR 30 DAYS   omeprazole 40 MG capsule Commonly known as: PRILOSEC Take 40 mg by mouth daily.       Allergies: No Known Allergies  Past Medical History, Surgical history, Social history, and Family History were reviewed and updated.  Review of Systems: All other 10 point review of systems is negative.   Physical Exam:  vitals were not taken for this visit.   Wt Readings from Last 3  Encounters:  09/08/19 231 lb (104.8 kg)  07/09/19 235 lb 0.8 oz (106.6 kg)  03/30/19 239 lb 12.8 oz (108.8 kg)    Ocular: Sclerae unicteric, pupils equal, round and reactive to light Ear-nose-throat: Oropharynx clear, dentition fair Lymphatic: No cervical or supraclavicular adenopathy Lungs no rales or rhonchi, good excursion bilaterally Heart regular rate and rhythm, no murmur appreciated Abd soft, nontender, positive bowel sounds MSK no focal spinal tenderness, no joint edema Neuro: non-focal, well-oriented, appropriate affect Breasts: Deferred   Lab Results  Component Value Date   WBC 7.2 12/08/2019   HGB 17.6 (H) 12/08/2019   HCT 53.1 (H) 12/08/2019   MCV 83.8 12/08/2019   PLT 215 12/08/2019   Lab Results  Component Value Date   FERRITIN 16 (L) 09/08/2019   IRON 79 09/08/2019   TIBC 394 09/08/2019   UIBC 315 09/08/2019   IRONPCTSAT 20 09/08/2019   Lab Results  Component Value Date   RETICCTPCT 1.7 04/28/2014   RBC 6.34 (H) 12/08/2019   RETICCTABS 103.2 04/28/2014   No results found for: KPAFRELGTCHN, LAMBDASER, KAPLAMBRATIO No results found for: IGGSERUM, IGA, IGMSERUM No results found for: [name], A1GS, A2GS, [name], MSPIKE, SPEI   Chemistry      Component Value Date/Time   NA 138 09/08/2019 1057   NA 140 12/12/2016 1420   NA 137 08/17/2015 1305   K 4.1 09/08/2019 1057  K 3.9 12/12/2016 1420   K 4.4 08/17/2015 1305   CL 107 09/08/2019 1057   CL 106 12/12/2016 1420   CO2 23 09/08/2019 1057   CO2 25 12/12/2016 1420   CO2 21 (L) 08/17/2015 1305   BUN 14 09/08/2019 1057   BUN 16 12/12/2016 1420   BUN 16.2 08/17/2015 1305   CREATININE 0.93 09/08/2019 1057   CREATININE 1.4 (H) 12/12/2016 1420   CREATININE 1.0 08/17/2015 1305      Component Value Date/Time   CALCIUM 9.8 09/08/2019 1057   CALCIUM 9.5 12/12/2016 1420   CALCIUM 9.3 08/17/2015 1305   ALKPHOS 97 09/08/2019 1057   ALKPHOS 112 (H) 12/12/2016 1420   ALKPHOS  126 08/17/2015 1305   AST 27 09/08/2019 1057   AST 25 08/17/2015 1305   ALT 43 09/08/2019 1057   ALT 48 (H) 12/12/2016 1420   ALT 40 08/17/2015 1305   BILITOT 0.8 09/08/2019 1057   BILITOT 0.56 08/17/2015 1305       Impression and Plan: Austin Poole is a pleasant 34yo [place] gentleman with secondary polycythemia due to smoking. We will proceed with phlebotomy today as planned.  Follow-up in 2 months.  He was encouraged to contact our office with any questions or concerns.   Laverna Peace, NP 11/2/202110:52 AM

## 2019-12-08 NOTE — Patient Instructions (Signed)
Therapeutic Phlebotomy Therapeutic phlebotomy is the planned removal of blood from a person's body for the purpose of treating a medical condition. The procedure is similar to donating blood. Usually, about a pint (470 mL, or 0.47 L) of blood is removed. The average adult has 9-12 pints (4.3-5.7 L) of blood in the body. Therapeutic phlebotomy may be used to treat the following medical conditions:  Hemochromatosis. This is a condition in which the blood contains too much iron.  Polycythemia vera. This is a condition in which the blood contains too many red blood cells.  Porphyria cutanea tarda. This is a disease in which an important part of hemoglobin is not made properly. It results in the buildup of abnormal amounts of porphyrins in the body.  Sickle cell disease. This is a condition in which the red blood cells form an abnormal crescent shape rather than a round shape. Tell a health care provider about:  Any allergies you have.  All medicines you are taking, including vitamins, herbs, eye drops, creams, and over-the-counter medicines.  Any problems you or family members have had with anesthetic medicines.  Any blood disorders you have.  Any surgeries you have had.  Any medical conditions you have.  Whether you are pregnant or may be pregnant. What are the risks? Generally, this is a safe procedure. However, problems may occur, including:  Nausea or light-headedness.  Low blood pressure (hypotension).  Soreness, bleeding, swelling, or bruising at the needle insertion site.  Infection. What happens before the procedure?  Follow instructions from your health care provider about eating or drinking restrictions.  Ask your health care provider about: ? Changing or stopping your regular medicines. This is especially important if you are taking diabetes medicines or blood thinners (anticoagulants). ? Taking medicines such as aspirin and ibuprofen. These medicines can thin your  blood. Do not take these medicines unless your health care provider tells you to take them. ? Taking over-the-counter medicines, vitamins, herbs, and supplements.  Wear clothing with sleeves that can be raised above the elbow.  Plan to have someone take you home from the hospital or clinic.  You may have a blood sample taken.  Your blood pressure, pulse rate, and breathing rate will be measured. What happens during the procedure?   To lower your risk of infection: ? Your health care team will wash or sanitize their hands. ? Your skin will be cleaned with an antiseptic.  You may be given a medicine to numb the area (local anesthetic).  A tourniquet will be placed on your arm.  A needle will be inserted into one of your veins.  Tubing and a collection bag will be attached to that needle.  Blood will flow through the needle and tubing into the collection bag.  The collection bag will be placed lower than your arm to allow gravity to help the flow of blood into the bag.  You may be asked to open and close your hand slowly and continually during the entire collection.  After the specified amount of blood has been removed from your body, the collection bag and tubing will be clamped.  The needle will be removed from your vein.  Pressure will be held on the site of the needle insertion to stop the bleeding.  A bandage (dressing) will be placed over the needle insertion site. The procedure may vary among health care providers and hospitals. What happens after the procedure?  Your blood pressure, pulse rate, and breathing rate will be   measured after the procedure.  You will be encouraged to drink fluids.  Your recovery will be assessed and monitored.  You can return to your normal activities as told by your health care provider. Summary  Therapeutic phlebotomy is the planned removal of blood from a person's body for the purpose of treating a medical condition.  Therapeutic  phlebotomy may be used to treat hemochromatosis, polycythemia vera, porphyria cutanea tarda, or sickle cell disease.  In the procedure, a needle is inserted and about a pint (470 mL, or 0.47 L) of blood is removed. The average adult has 9-12 pints (4.3-5.7 L) of blood in the body.  This is generally a safe procedure, but it can sometimes cause problems such as nausea, light-headedness, or low blood pressure (hypotension). This information is not intended to replace advice given to you by your health care provider. Make sure you discuss any questions you have with your health care provider. Document Revised: 02/07/2017 Document Reviewed: 02/07/2017 Elsevier Patient Education  2020 [employer] Inc[employer]  

## 2019-12-09 LAB — IRON AND TIBC
Iron: 59 ug/dL (ref 42–163)
Saturation Ratios: 17 % — ABNORMAL LOW (ref 20–55)
TIBC: 351 ug/dL (ref 202–409)
UIBC: 292 ug/dL (ref 117–376)

## 2019-12-09 LAB — FERRITIN: Ferritin: 26 ng/mL (ref 24–336)

## 2020-02-08 ENCOUNTER — Ambulatory Visit: Payer: 59 | Admitting: Family

## 2020-02-08 ENCOUNTER — Other Ambulatory Visit: Payer: 59

## 2020-02-09 ENCOUNTER — Encounter: Payer: Self-pay | Admitting: Family

## 2020-02-09 ENCOUNTER — Other Ambulatory Visit: Payer: Self-pay

## 2020-02-09 ENCOUNTER — Inpatient Hospital Stay: Payer: 59

## 2020-02-09 ENCOUNTER — Inpatient Hospital Stay: Payer: 59 | Attending: Family

## 2020-02-09 ENCOUNTER — Inpatient Hospital Stay (HOSPITAL_BASED_OUTPATIENT_CLINIC_OR_DEPARTMENT_OTHER): Payer: 59 | Admitting: Family

## 2020-02-09 VITALS — BP 137/81 | HR 81 | Temp 97.9°F | Resp 18 | Ht 69.0 in | Wt 224.1 lb

## 2020-02-09 DIAGNOSIS — D500 Iron deficiency anemia secondary to blood loss (chronic): Secondary | ICD-10-CM

## 2020-02-09 DIAGNOSIS — Z7982 Long term (current) use of aspirin: Secondary | ICD-10-CM | POA: Insufficient documentation

## 2020-02-09 DIAGNOSIS — Z79899 Other long term (current) drug therapy: Secondary | ICD-10-CM | POA: Insufficient documentation

## 2020-02-09 DIAGNOSIS — F17210 Nicotine dependence, cigarettes, uncomplicated: Secondary | ICD-10-CM | POA: Insufficient documentation

## 2020-02-09 DIAGNOSIS — D751 Secondary polycythemia: Secondary | ICD-10-CM

## 2020-02-09 LAB — CBC WITH DIFFERENTIAL (CANCER CENTER ONLY)
Abs Immature Granulocytes: 0.04 10*3/uL (ref 0.00–0.07)
Basophils Absolute: 0.1 10*3/uL (ref 0.0–0.1)
Basophils Relative: 1 %
Eosinophils Absolute: 0.4 10*3/uL (ref 0.0–0.5)
Eosinophils Relative: 5 %
HCT: 55.6 % — ABNORMAL HIGH (ref 39.0–52.0)
Hemoglobin: 18.7 g/dL — ABNORMAL HIGH (ref 13.0–17.0)
Immature Granulocytes: 1 %
Lymphocytes Relative: 27 %
Lymphs Abs: 2.1 10*3/uL (ref 0.7–4.0)
MCH: 27.9 pg (ref 26.0–34.0)
MCHC: 33.6 g/dL (ref 30.0–36.0)
MCV: 83 fL (ref 80.0–100.0)
Monocytes Absolute: 0.4 10*3/uL (ref 0.1–1.0)
Monocytes Relative: 5 %
Neutro Abs: 5.1 10*3/uL (ref 1.7–7.7)
Neutrophils Relative %: 61 %
Platelet Count: 241 10*3/uL (ref 150–400)
RBC: 6.7 MIL/uL — ABNORMAL HIGH (ref 4.22–5.81)
RDW: 14.7 % (ref 11.5–15.5)
WBC Count: 8.1 10*3/uL (ref 4.0–10.5)
nRBC: 0 % (ref 0.0–0.2)

## 2020-02-09 LAB — CMP (CANCER CENTER ONLY)
ALT: 32 U/L (ref 0–44)
AST: 20 U/L (ref 15–41)
Albumin: 4.2 g/dL (ref 3.5–5.0)
Alkaline Phosphatase: 92 U/L (ref 38–126)
Anion gap: 8 (ref 5–15)
BUN: 18 mg/dL (ref 6–20)
CO2: 27 mmol/L (ref 22–32)
Calcium: 9.9 mg/dL (ref 8.9–10.3)
Chloride: 103 mmol/L (ref 98–111)
Creatinine: 1.15 mg/dL (ref 0.61–1.24)
GFR, Estimated: >60 mL/min (ref >60)
Glucose, Bld: 128 mg/dL — ABNORMAL HIGH (ref 70–99)
Potassium: 3.8 mmol/L (ref 3.5–5.1)
Sodium: 138 mmol/L (ref 135–145)
Total Bilirubin: 0.7 mg/dL (ref 0.3–1.2)
Total Protein: 6.7 g/dL (ref 6.5–8.1)

## 2020-02-09 NOTE — Progress Notes (Signed)
Austin Poole presents today for phlebotomy per MD orders. Phlebotomy procedure started at 1123 and ended at 1132. 520 cc removed via 16 G needle at L lateral AC. Patient tolerated procedure well.  Pt discharged in no apparent distress. Pt left ambulatory without assistance. Pt aware of discharge instructions and verbalized understanding and had no further questions. Stable and asymptomatic upon discharge.

## 2020-02-09 NOTE — Patient Instructions (Signed)

## 2020-02-09 NOTE — Progress Notes (Signed)
Hematology and Oncology Follow Up Visit  Delwin Raczkowski [id number] April 28, 1985 35 y.o. 02/09/2020   Principle Diagnosis:  Secondary polycythemia- smoking JAK2 negative - 04/28/2014  Current Therapy: Aspirin 162 mg by mouth daily Phlebotomy to maintain hematocrit below 48%              Interim History:  Mr. Jaime is here today for follow-up. He is doing well and has no complaints at this time.  His Hct is 55.6% and he states that he smoked a few extra cigarettes last night while having some beers with friends.  He admits that he needs to better hydrate throughout the day as well.  He takes 2 baby aspirin most days.  No issue with bleeding. No bruising or petechiae.  No fever, chills, n/v, cough, rash, dizziness, SOB, chest pain, palpitations, abdominal pain or changes in bowel or bladder habits.  No swelling, tenderness, numbness or tingling in his extremities.  No falls or syncope.  He has started working again and is down 7 lbs.  He has maintained a healthy appetite.   ECOG Performance Status: 1 - Symptomatic but completely ambulatory  Medications:  Allergies as of 02/09/2020   No Known Allergies     Medication List       Accurate as of February 09, 2020 10:37 AM. If you have any questions, ask your nurse or doctor.        amLODipine 10 MG tablet Commonly known as: NORVASC Take 10 mg by mouth daily.   aspirin 325 MG tablet Take 325 mg by mouth daily. Reported on 03/09/2015   GaviLAX 17 GM/SCOOP powder Generic drug: polyethylene glycol powder TAKE 17 GRAMS MIXED WITH 8OZ WATER, JUICE, SODA, COFFEE OR TEA BY MOUTH ONE TIME DAILY FOR 30 DAYS   omeprazole 40 MG capsule Commonly known as: PRILOSEC Take 40 mg by mouth daily.       Allergies: No Known Allergies  Past Medical History, Surgical history, Social history, and Family History were reviewed and updated.  Review of Systems: All other 10 point review of systems is negative.   Physical Exam:  vitals were  not taken for this visit.   Wt Readings from Last 3 Encounters:  12/08/19 231 lb 1.3 oz (104.8 kg)  09/08/19 231 lb (104.8 kg)  07/09/19 235 lb 0.8 oz (106.6 kg)    Ocular: Sclerae unicteric, pupils equal, round and reactive to light Ear-nose-throat: Oropharynx clear, dentition fair Lymphatic: No cervical or supraclavicular adenopathy Lungs no rales or rhonchi, good excursion bilaterally Heart regular rate and rhythm, no murmur appreciated Abd soft, nontender, positive bowel sounds MSK no focal spinal tenderness, no joint edema Neuro: non-focal, well-oriented, appropriate affect Breasts: Deferred   Lab Results  Component Value Date   WBC 7.2 12/08/2019   HGB 17.6 (H) 12/08/2019   HCT 53.1 (H) 12/08/2019   MCV 83.8 12/08/2019   PLT 215 12/08/2019   Lab Results  Component Value Date   FERRITIN 26 12/08/2019   IRON 59 12/08/2019   TIBC 351 12/08/2019   UIBC 292 12/08/2019   IRONPCTSAT 17 (L) 12/08/2019   Lab Results  Component Value Date   RETICCTPCT 1.7 04/28/2014   RBC 6.34 (H) 12/08/2019   RETICCTABS 103.2 04/28/2014   No results found for: KPAFRELGTCHN, LAMBDASER, KAPLAMBRATIO No results found for: IGGSERUM, IGA, IGMSERUM No results found for: TOTALPROTELP, ALBUMINELP, A1GS, A2GS, BETS, BETA2SER, GAMS, MSPIKE, SPEI   Chemistry      Component Value Date/Time   NA 141 12/08/2019 1030  NA 140 12/12/2016 1420   NA 137 08/17/2015 1305   K 4.3 12/08/2019 1030   K 3.9 12/12/2016 1420   K 4.4 08/17/2015 1305   CL 107 12/08/2019 1030   CL 106 12/12/2016 1420   CO2 27 12/08/2019 1030   CO2 25 12/12/2016 1420   CO2 21 (L) 08/17/2015 1305   BUN 15 12/08/2019 1030   BUN 16 12/12/2016 1420   BUN 16.2 08/17/2015 1305   CREATININE 1.02 12/08/2019 1030   CREATININE 1.4 (H) 12/12/2016 1420   CREATININE 1.0 08/17/2015 1305      Component Value Date/Time   CALCIUM 9.5 12/08/2019 1030   CALCIUM 9.5 12/12/2016 1420   CALCIUM 9.3 08/17/2015 1305   ALKPHOS 94 12/08/2019  1030   ALKPHOS 112 (H) 12/12/2016 1420   ALKPHOS 126 08/17/2015 1305   AST 25 12/08/2019 1030   AST 25 08/17/2015 1305   ALT 34 12/08/2019 1030   ALT 48 (H) 12/12/2016 1420   ALT 40 08/17/2015 1305   BILITOT 0.5 12/08/2019 1030   BILITOT 0.56 08/17/2015 1305       Impression and Plan: Mr. Cantave is a pleasant 35yo [place] gentleman with secondary polycythemia due to smoking. Phlebotomy today for Hct 55.6%. We discussed him coming in more often for phlebotomy to reduce his Hct but it is hard for him with his work and travelling [place].  We will do another phlebotomy in a month when he comes back from his trip for his brother's wedding.  Follow-up in 2 months.  He can contact our office with any questions or concerns. We can certainly see him sooner if needed.   Laverna Peace, NP 1/4/202210:37 AM

## 2020-02-10 LAB — IRON AND TIBC
Iron: 63 ug/dL (ref 42–163)
Saturation Ratios: 16 % — ABNORMAL LOW (ref 20–55)
TIBC: 386 ug/dL (ref 202–409)
UIBC: 323 ug/dL (ref 117–376)

## 2020-02-10 LAB — FERRITIN: Ferritin: 23 ng/mL — ABNORMAL LOW (ref 24–336)

## 2020-03-08 ENCOUNTER — Other Ambulatory Visit: Payer: Self-pay

## 2020-03-08 ENCOUNTER — Inpatient Hospital Stay: Payer: 59 | Attending: Family

## 2020-03-08 DIAGNOSIS — D751 Secondary polycythemia: Secondary | ICD-10-CM | POA: Diagnosis not present

## 2020-03-08 NOTE — Progress Notes (Signed)
Ricke Hey presents today for phlebotomy per MD orders. Phlebotomy procedure started at 1110 and ended at  1117.   522 cc removed. Patient tolerated procedure well. IV needle removed intact.

## 2020-03-08 NOTE — Patient Instructions (Signed)

## 2020-04-06 ENCOUNTER — Telehealth: Payer: Self-pay

## 2020-04-06 NOTE — Telephone Encounter (Signed)
Wifept called to r/s his 3/4/appt to next week, done    Keileigh Vahey

## 2020-04-08 ENCOUNTER — Inpatient Hospital Stay: Payer: 59 | Admitting: Family

## 2020-04-08 ENCOUNTER — Inpatient Hospital Stay: Payer: 59

## 2020-04-13 ENCOUNTER — Inpatient Hospital Stay: Payer: 59

## 2020-04-13 ENCOUNTER — Inpatient Hospital Stay (HOSPITAL_BASED_OUTPATIENT_CLINIC_OR_DEPARTMENT_OTHER): Payer: 59 | Admitting: Family

## 2020-04-13 ENCOUNTER — Encounter: Payer: Self-pay | Admitting: Family

## 2020-04-13 ENCOUNTER — Other Ambulatory Visit: Payer: Self-pay

## 2020-04-13 ENCOUNTER — Telehealth: Payer: Self-pay | Admitting: Family

## 2020-04-13 ENCOUNTER — Inpatient Hospital Stay: Payer: 59 | Attending: Family

## 2020-04-13 VITALS — BP 120/91 | HR 68 | Temp 97.9°F | Resp 18 | Ht 69.0 in | Wt 230.8 lb

## 2020-04-13 DIAGNOSIS — F17210 Nicotine dependence, cigarettes, uncomplicated: Secondary | ICD-10-CM | POA: Insufficient documentation

## 2020-04-13 DIAGNOSIS — D500 Iron deficiency anemia secondary to blood loss (chronic): Secondary | ICD-10-CM | POA: Diagnosis not present

## 2020-04-13 DIAGNOSIS — Z7982 Long term (current) use of aspirin: Secondary | ICD-10-CM | POA: Diagnosis not present

## 2020-04-13 DIAGNOSIS — D751 Secondary polycythemia: Secondary | ICD-10-CM | POA: Diagnosis present

## 2020-04-13 LAB — CBC WITH DIFFERENTIAL (CANCER CENTER ONLY)
Abs Immature Granulocytes: 0 10*3/uL (ref 0.00–0.07)
Band Neutrophils: 0 %
Basophils Absolute: 0.1 10*3/uL (ref 0.0–0.1)
Basophils Relative: 1 %
Blasts: 0 %
Eosinophils Absolute: 0.3 10*3/uL (ref 0.0–0.5)
Eosinophils Relative: 4 %
HCT: 51.5 % (ref 39.0–52.0)
Hemoglobin: 17.1 g/dL — ABNORMAL HIGH (ref 13.0–17.0)
Lymphocytes Relative: 36 %
Lymphs Abs: 3 10*3/uL (ref 0.7–4.0)
MCH: 27 pg (ref 26.0–34.0)
MCHC: 33.2 g/dL (ref 30.0–36.0)
MCV: 81.4 fL (ref 80.0–100.0)
Metamyelocytes Relative: 0 %
Monocytes Absolute: 0.7 10*3/uL (ref 0.1–1.0)
Monocytes Relative: 9 %
Myelocytes: 0 %
Neutro Abs: 4.1 10*3/uL (ref 1.7–7.7)
Neutrophils Relative %: 50 %
Other: 0 %
Platelet Count: 261 10*3/uL (ref 150–400)
Promyelocytes Relative: 0 %
RBC: 6.33 MIL/uL — ABNORMAL HIGH (ref 4.22–5.81)
RDW: 14.1 % (ref 11.5–15.5)
WBC Count: 8.2 10*3/uL (ref 4.0–10.5)
nRBC: 0 % (ref 0.0–0.2)
nRBC: 0 /100 WBC

## 2020-04-13 LAB — CMP (CANCER CENTER ONLY)
ALT: 34 U/L (ref 0–44)
AST: 22 U/L (ref 15–41)
Albumin: 4.4 g/dL (ref 3.5–5.0)
Alkaline Phosphatase: 95 U/L (ref 38–126)
Anion gap: 6 (ref 5–15)
BUN: 15 mg/dL (ref 6–20)
CO2: 29 mmol/L (ref 22–32)
Calcium: 10.3 mg/dL (ref 8.9–10.3)
Chloride: 101 mmol/L (ref 98–111)
Creatinine: 1.05 mg/dL (ref 0.61–1.24)
GFR, Estimated: >60 mL/min (ref >60)
Glucose, Bld: 96 mg/dL (ref 70–99)
Potassium: 4.4 mmol/L (ref 3.5–5.1)
Sodium: 136 mmol/L (ref 135–145)
Total Bilirubin: 0.7 mg/dL (ref 0.3–1.2)
Total Protein: 6.8 g/dL (ref 6.5–8.1)

## 2020-04-13 NOTE — Progress Notes (Signed)
Austin Poole presents today for phlebotomy per MD orders. Phlebotomy procedure started at 1437 and ended at 1454. 376  grams removed from rt [facility] using 16g phlebotomy kit by DSmith, RN before clotting off. Additional 217 removed from rt [facility] by [facility], RN.  Patient tolerated procedure well. IV needle removed intact. Declined 30 minute observation period.

## 2020-04-13 NOTE — Telephone Encounter (Signed)
Appointments scheduled patient has My Chart per 3/9 los

## 2020-04-13 NOTE — Progress Notes (Signed)
Hematology and Oncology Follow Up Visit  Austin Poole [id number] 07-Nov-1985 35 y.o. 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 He is doing well but notes fatigue at times.  He states that he had gained some weight back over the winter and plans to start working out again.  He has cut back significantly on his smoking and his Hct is noted to be lower at 51% today.  He has a good appetite and weight is stable at 230 lbs.  No fever, chills, n/v, cough, rash, dizziness, SOB, chest pain, palpitations, abdominal pain or changes in bowel or bladder habits.  No swelling, tenderness, numbness or tingling in his extremities at this time.  No falls or syncope.  He has not noted any blood loss. No bruising or petechiae.   ECOG Performance Status: 1 - Symptomatic but completely ambulatory  Medications:  Allergies as of 04/13/2020   No Known Allergies     Medication List       Accurate as of April 13, 2020  1:46 PM. If you have any questions, ask your nurse or doctor.        amLODipine 10 MG tablet Commonly known as: NORVASC Take 10 mg by mouth daily.   aspirin 325 MG tablet Take 325 mg by mouth daily. Reported on 03/09/2015   GaviLAX 17 GM/SCOOP powder Generic drug: polyethylene glycol powder TAKE 17 GRAMS MIXED WITH 8OZ WATER, JUICE, SODA, COFFEE OR TEA BY MOUTH ONE TIME DAILY FOR 30 DAYS   hydrochlorothiazide 12.5 MG capsule Commonly known as: MICROZIDE Take by mouth.   omeprazole 40 MG capsule Commonly known as: PRILOSEC Take 40 mg by mouth daily.       Allergies: No Known Allergies  Past Medical History, Surgical history, Social history, and Family History were reviewed and updated.  Review of Systems: All other 10 point review of systems is  negative.   Physical Exam:  vitals were not taken for this visit.   Wt Readings from Last 3 Encounters:  02/09/20 224 lb 1.9 oz (101.7 kg)  12/08/19 231 lb 1.3 oz (104.8 kg)  09/08/19 231 lb (104.8 kg)    Ocular: Sclerae unicteric, pupils equal, round and reactive to light Ear-nose-throat: Oropharynx clear, dentition fair Lymphatic: No cervical or supraclavicular adenopathy Lungs no rales or rhonchi, good excursion bilaterally Heart regular rate and rhythm, no murmur appreciated Abd soft, nontender, positive bowel sounds MSK no focal spinal tenderness, no joint edema Neuro: non-focal, well-oriented, appropriate affect Breasts: Deferred  Lab Results  Component Value Date   WBC 8.2 04/13/2020   HGB 17.1 (H) 04/13/2020   HCT 51.5 04/13/2020   MCV 81.4 04/13/2020   PLT 261 04/13/2020   Lab Results  Component Value Date   FERRITIN 23 (L) 02/09/2020   IRON 63 02/09/2020   TIBC 386 02/09/2020   UIBC 323 02/09/2020   IRONPCTSAT 16 (L) 02/09/2020   Lab Results  Component Value Date   RETICCTPCT 1.7 04/28/2014   RBC 6.33 (H) 04/13/2020   RETICCTABS 103.2 04/28/2014   No results found for: KPAFRELGTCHN, LAMBDASER, KAPLAMBRATIO No results found for: IGGSERUM, IGA, IGMSERUM No results found for: TOTALPROTELP, ALBUMINELP, A1GS, A2GS, BETS, BETA2SER, GAMS, MSPIKE, SPEI   Chemistry      Component Value Date/Time   NA 138 02/09/2020 1026  NA 140 12/12/2016 1420   NA 137 08/17/2015 1305   K 3.8 02/09/2020 1026   K 3.9 12/12/2016 1420   K 4.4 08/17/2015 1305   CL 103 02/09/2020 1026   CL 106 12/12/2016 1420   CO2 27 02/09/2020 1026   CO2 25 12/12/2016 1420   CO2 21 (L) 08/17/2015 1305   BUN 18 02/09/2020 1026   BUN 16 12/12/2016 1420   BUN 16.2 08/17/2015 1305   CREATININE 1.15 02/09/2020 1026   CREATININE 1.4 (H) 12/12/2016 1420   CREATININE 1.0 08/17/2015 1305      Component Value Date/Time   CALCIUM 9.9 02/09/2020 1026   CALCIUM 9.5 12/12/2016 1420   CALCIUM 9.3  08/17/2015 1305   ALKPHOS 92 02/09/2020 1026   ALKPHOS 112 (H) 12/12/2016 1420   ALKPHOS 126 08/17/2015 1305   AST 20 02/09/2020 1026   AST 25 08/17/2015 1305   ALT 32 02/09/2020 1026   ALT 48 (H) 12/12/2016 1420   ALT 40 08/17/2015 1305   BILITOT 0.7 02/09/2020 1026   BILITOT 0.56 08/17/2015 1305       Impression and Plan: Austin Poole is a pleasant 35yo [place] gentleman with secondary polycythemia due to smoking.He also has iron deficiency secondary to phlebotomies. We will proceed with phlebotomy today, Hct 52%.  He was encouraged to continue to cut back his smoking as this will help his Hct.  Follow-up in another 2 months.  They can contact our office with any questions or concerns.    Laverna Peace, NP 3/9/20221:46 PM

## 2020-04-14 LAB — IRON AND TIBC
Iron: 61 ug/dL (ref 42–163)
Saturation Ratios: 14 % — ABNORMAL LOW (ref 20–55)
TIBC: 428 ug/dL — ABNORMAL HIGH (ref 202–409)
UIBC: 367 ug/dL (ref 117–376)

## 2020-04-14 LAB — FERRITIN: Ferritin: 13 ng/mL — ABNORMAL LOW (ref 24–336)

## 2020-06-15 ENCOUNTER — Telehealth: Payer: Self-pay

## 2020-06-15 ENCOUNTER — Inpatient Hospital Stay: Payer: 59 | Attending: Family

## 2020-06-15 ENCOUNTER — Inpatient Hospital Stay: Payer: 59

## 2020-06-15 ENCOUNTER — Other Ambulatory Visit: Payer: Self-pay

## 2020-06-15 ENCOUNTER — Inpatient Hospital Stay (HOSPITAL_BASED_OUTPATIENT_CLINIC_OR_DEPARTMENT_OTHER): Payer: 59 | Admitting: Family

## 2020-06-15 VITALS — BP 132/82 | HR 73 | Temp 98.3°F | Resp 17 | Ht 69.0 in | Wt 239.0 lb

## 2020-06-15 DIAGNOSIS — Z7982 Long term (current) use of aspirin: Secondary | ICD-10-CM | POA: Insufficient documentation

## 2020-06-15 DIAGNOSIS — D751 Secondary polycythemia: Secondary | ICD-10-CM | POA: Insufficient documentation

## 2020-06-15 DIAGNOSIS — D500 Iron deficiency anemia secondary to blood loss (chronic): Secondary | ICD-10-CM

## 2020-06-15 DIAGNOSIS — F17210 Nicotine dependence, cigarettes, uncomplicated: Secondary | ICD-10-CM | POA: Insufficient documentation

## 2020-06-15 LAB — CBC WITH DIFFERENTIAL (CANCER CENTER ONLY)
Abs Immature Granulocytes: 0.03 10*3/uL (ref 0.00–0.07)
Basophils Absolute: 0 10*3/uL (ref 0.0–0.1)
Basophils Relative: 0 %
Eosinophils Absolute: 0.4 10*3/uL (ref 0.0–0.5)
Eosinophils Relative: 6 %
HCT: 49.5 % (ref 39.0–52.0)
Hemoglobin: 16.4 g/dL (ref 13.0–17.0)
Immature Granulocytes: 0 %
Lymphocytes Relative: 37 %
Lymphs Abs: 2.8 10*3/uL (ref 0.7–4.0)
MCH: 26.4 pg (ref 26.0–34.0)
MCHC: 33.1 g/dL (ref 30.0–36.0)
MCV: 79.6 fL — ABNORMAL LOW (ref 80.0–100.0)
Monocytes Absolute: 0.6 10*3/uL (ref 0.1–1.0)
Monocytes Relative: 8 %
Neutro Abs: 3.7 10*3/uL (ref 1.7–7.7)
Neutrophils Relative %: 49 %
Platelet Count: 237 10*3/uL (ref 150–400)
RBC: 6.22 MIL/uL — ABNORMAL HIGH (ref 4.22–5.81)
RDW: 14.9 % (ref 11.5–15.5)
WBC Count: 7.6 10*3/uL (ref 4.0–10.5)
nRBC: 0 % (ref 0.0–0.2)

## 2020-06-15 LAB — CMP (CANCER CENTER ONLY)
ALT: 32 U/L (ref 0–44)
AST: 21 U/L (ref 15–41)
Albumin: 4.1 g/dL (ref 3.5–5.0)
Alkaline Phosphatase: 89 U/L (ref 38–126)
Anion gap: 7 (ref 5–15)
BUN: 16 mg/dL (ref 6–20)
CO2: 26 mmol/L (ref 22–32)
Calcium: 9.8 mg/dL (ref 8.9–10.3)
Chloride: 103 mmol/L (ref 98–111)
Creatinine: 1.01 mg/dL (ref 0.61–1.24)
GFR, Estimated: >60 mL/min (ref >60)
Glucose, Bld: 130 mg/dL — ABNORMAL HIGH (ref 70–99)
Potassium: 4.1 mmol/L (ref 3.5–5.1)
Sodium: 136 mmol/L (ref 135–145)
Total Bilirubin: 0.6 mg/dL (ref 0.3–1.2)
Total Protein: 6.5 g/dL (ref 6.5–8.1)

## 2020-06-15 NOTE — Telephone Encounter (Signed)
appts made for pt per 06/15/20 los, pt declined a calendar    Austin Poole  

## 2020-06-15 NOTE — Progress Notes (Signed)
Hematology and Oncology Follow Up Visit  Austin Poole [id number] Nov 25, 1985 35 y.o. 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 He is doing well and has no complaints at this time.  He is down to only smoking 1-2 cigarettes a day.  He has not been taking aspirin daily but will restart.  No fever, chills, n/v, cough, rash, dizziness, SOB, chest pain, palpitations, abdominal pain/bloating or changes in bowel or bladder habits.  No bleeding, bruising or petechiae.  No swelling, tenderness, numbness or tingling in her extremities.  No falls or syncope to report.  He is eating well and staying hydrated throughout the day. His weight is 239 lbs.   ECOG Performance Status: 1 - Symptomatic but completely ambulatory  Medications:  Allergies as of 06/15/2020   No Known Allergies     Medication List       Accurate as of Jun 15, 2020  1:12 PM. If you have any questions, ask your nurse or doctor.        aspirin 325 MG tablet Take 325 mg by mouth daily. Reported on 03/09/2015   GaviLAX 17 GM/SCOOP powder Generic drug: polyethylene glycol powder TAKE 17 GRAMS MIXED WITH 8OZ WATER, JUICE, SODA, COFFEE OR TEA BY MOUTH ONE TIME DAILY FOR 30 DAYS   hydrochlorothiazide 12.5 MG capsule Commonly known as: MICROZIDE Take by mouth.   omeprazole 40 MG capsule Commonly known as: PRILOSEC Take 40 mg by mouth daily.       Allergies: No Known Allergies  Past Medical History, Surgical history, Social history, and Family History were reviewed and updated.  Review of Systems: All other 10 point review of systems is negative.   Physical Exam:  vitals were not taken for this visit.   Wt Readings from Last 3 Encounters:  04/13/20 230 lb 12.8 oz (104.7 kg)  02/09/20 224  lb 1.9 oz (101.7 kg)  12/08/19 231 lb 1.3 oz (104.8 kg)    Ocular: Sclerae unicteric, pupils equal, round and reactive to light Ear-nose-throat: Oropharynx clear, dentition fair Lymphatic: No cervical or supraclavicular adenopathy Lungs no rales or rhonchi, good excursion bilaterally Heart regular rate and rhythm, no murmur appreciated Abd soft, nontender, positive bowel sounds MSK no focal spinal tenderness, no joint edema Neuro: non-focal, well-oriented, appropriate affect Breasts: Deferred   Lab Results  Component Value Date   WBC 7.6 06/15/2020   HGB 16.4 06/15/2020   HCT 49.5 06/15/2020   MCV 79.6 (L) 06/15/2020   PLT 237 06/15/2020   Lab Results  Component Value Date   FERRITIN 13 (L) 04/13/2020   IRON 61 04/13/2020   TIBC 428 (H) 04/13/2020   UIBC 367 04/13/2020   IRONPCTSAT 14 (L) 04/13/2020   Lab Results  Component Value Date   RETICCTPCT 1.7 04/28/2014   RBC 6.22 (H) 06/15/2020   RETICCTABS 103.2 04/28/2014   No results found for: KPAFRELGTCHN, LAMBDASER, KAPLAMBRATIO No results found for: IGGSERUM, IGA, IGMSERUM No results found for: [name], A1GS, A2GS, [name], MSPIKE, SPEI   Chemistry      Component Value Date/Time   NA 136 04/13/2020 1313   NA 140 12/12/2016 1420   NA 137 08/17/2015 1305   K 4.4 04/13/2020 1313   K 3.9 12/12/2016 1420   K 4.4 08/17/2015 1305   CL 101  04/13/2020 1313   CL 106 12/12/2016 1420   CO2 29 04/13/2020 1313   CO2 25 12/12/2016 1420   CO2 21 (L) 08/17/2015 1305   BUN 15 04/13/2020 1313   BUN 16 12/12/2016 1420   BUN 16.2 08/17/2015 1305   CREATININE 1.05 04/13/2020 1313   CREATININE 1.4 (H) 12/12/2016 1420   CREATININE 1.0 08/17/2015 1305      Component Value Date/Time   CALCIUM 10.3 04/13/2020 1313   CALCIUM 9.5 12/12/2016 1420   CALCIUM 9.3 08/17/2015 1305   ALKPHOS 95 04/13/2020 1313   ALKPHOS 112 (H) 12/12/2016 1420   ALKPHOS 126 08/17/2015 1305   AST 22 04/13/2020 1313   AST  25 08/17/2015 1305   ALT 34 04/13/2020 1313   ALT 48 (H) 12/12/2016 1420   ALT 40 08/17/2015 1305   BILITOT 0.7 04/13/2020 1313   BILITOT 0.56 08/17/2015 1305       Impression and Plan: Austin Poole is a pleasant 35yo [place] gentleman with secondary polycythemia due to smoking.He also has chronic iron deficiency secondary to phlebotomies. Patient had phlebotomy today for Hct 49.5%.  Follow-up in 2 months.  They can contact our office with any questions or concerns.   Laverna Peace, NP 5/11/20221:12 PM

## 2020-06-15 NOTE — Patient Instructions (Signed)

## 2020-06-16 LAB — IRON AND TIBC
Iron: 40 ug/dL — ABNORMAL LOW (ref 42–163)
Saturation Ratios: 10 % — ABNORMAL LOW (ref 20–55)
TIBC: 393 ug/dL (ref 202–409)
UIBC: 353 ug/dL (ref 117–376)

## 2020-06-16 LAB — FERRITIN: Ferritin: 16 ng/mL — ABNORMAL LOW (ref 24–336)

## 2020-06-21 ENCOUNTER — Other Ambulatory Visit: Payer: Self-pay

## 2020-06-21 ENCOUNTER — Ambulatory Visit (INDEPENDENT_AMBULATORY_CARE_PROVIDER_SITE_OTHER): Payer: 59 | Admitting: Podiatry

## 2020-06-21 DIAGNOSIS — L600 Ingrowing nail: Secondary | ICD-10-CM

## 2020-06-22 ENCOUNTER — Encounter: Payer: Self-pay | Admitting: Podiatry

## 2020-06-22 NOTE — Progress Notes (Signed)
Subjective:  Patient ID: Austin Poole, male    DOB: 10-12-1985,  MRN: [id number]  Chief Complaint  Patient presents with  . Nail Problem    Right hallux nail possible ingrown painful nail     35 y.o. male presents with the above complaint.  Patient presents with right hallux lateral border ingrown.  Is painful to touch.  She would like to have removed.  She has not had a removed in the past.  She is tried some self debridement which has not helped.  She denies any other acute movement she has not seen anyone else prior to seeing me.  It is not currently infected.   Review of Systems: Negative except as noted in the HPI. Denies N/V/F/Ch.  Past Medical History:  Diagnosis Date  . Lipoma 04/25/2011  . Polycythemia secondary to smoking 03/09/2015    Current Outpatient Medications:  .  aspirin 325 MG tablet, Take 325 mg by mouth daily. Reported on 03/09/2015, Disp: , Rfl:  .  cetirizine (ZYRTEC) 10 MG tablet, Take by mouth., Disp: , Rfl:  .  fluticasone (FLONASE) 50 MCG/ACT nasal spray, Place into the nose., Disp: , Rfl:  .  GAVILAX 17 GM/SCOOP powder, TAKE 17 GRAMS MIXED WITH 8OZ WATER, JUICE, SODA, COFFEE OR TEA BY MOUTH ONE TIME DAILY FOR 30 DAYS, Disp: , Rfl:  .  hydrochlorothiazide (MICROZIDE) 12.5 MG capsule, Take by mouth., Disp: , Rfl:  .  omeprazole (PRILOSEC) 40 MG capsule, Take 40 mg by mouth daily., Disp: , Rfl:   Social History   Tobacco Use  Smoking Status Current Every Day Smoker  . Packs/day: 1.00  . Years: 7.00  . Pack years: 7.00  . Types: Cigarettes  . Start date: 04/28/2006  Smokeless Tobacco Never Used    No Known Allergies Objective:  There were no vitals filed for this visit. There is no height or weight on file to calculate BMI. Constitutional Well developed. Well nourished.  Vascular Dorsalis pedis pulses palpable bilaterally. Posterior tibial pulses palpable bilaterally. Capillary refill normal to all digits.  No cyanosis or clubbing noted. Pedal hair  growth normal.  Neurologic Normal speech. Oriented to person, place, and time. Epicritic sensation to light touch grossly present bilaterally.  Dermatologic Painful ingrowing nail at lateral nail borders of the hallux nail right. No other open wounds. No skin lesions.  Orthopedic: Normal joint ROM without pain or crepitus bilaterally. No visible deformities. No bony tenderness.   Radiographs: None Assessment:   1. Ingrown toenail of right foot    Plan:  Patient was evaluated and treated and all questions answered.  Ingrown Nail, right -Patient elects to proceed with minor surgery to remove ingrown toenail removal today. Consent reviewed and signed by patient. -Ingrown nail excised. See procedure note. -Educated on post-procedure care including soaking. Written instructions provided and reviewed. -Patient to follow up in 2 weeks for nail check.  Procedure: Excision of Ingrown Toenail Location: Right 1st toe lateral nail borders. Anesthesia: Lidocaine 1% plain; 1.5 mL and Marcaine 0.5% plain; 1.5 mL, digital block. Skin Prep: Betadine. Dressing: Silvadene; telfa; dry, sterile, compression dressing. Technique: Following skin prep, the toe was exsanguinated and a tourniquet was secured at the base of the toe. The affected nail border was freed, split with a nail splitter, and excised. Chemical matrixectomy was then performed with phenol and irrigated out with alcohol. The tourniquet was then removed and sterile dressing applied. Disposition: Patient tolerated procedure well. Patient to return in 2 weeks for follow-up.  No follow-ups on file.

## 2020-08-24 ENCOUNTER — Other Ambulatory Visit: Payer: Self-pay

## 2020-08-24 ENCOUNTER — Inpatient Hospital Stay: Payer: 59

## 2020-08-24 ENCOUNTER — Encounter: Payer: Self-pay | Admitting: Family

## 2020-08-24 ENCOUNTER — Inpatient Hospital Stay (HOSPITAL_BASED_OUTPATIENT_CLINIC_OR_DEPARTMENT_OTHER): Payer: 59 | Admitting: Family

## 2020-08-24 ENCOUNTER — Inpatient Hospital Stay: Payer: 59 | Attending: Family

## 2020-08-24 VITALS — BP 135/85 | HR 76 | Temp 97.8°F | Resp 18 | Ht 69.0 in | Wt 239.1 lb

## 2020-08-24 DIAGNOSIS — D751 Secondary polycythemia: Secondary | ICD-10-CM

## 2020-08-24 DIAGNOSIS — Z79899 Other long term (current) drug therapy: Secondary | ICD-10-CM | POA: Insufficient documentation

## 2020-08-24 DIAGNOSIS — D500 Iron deficiency anemia secondary to blood loss (chronic): Secondary | ICD-10-CM

## 2020-08-24 DIAGNOSIS — Z7982 Long term (current) use of aspirin: Secondary | ICD-10-CM | POA: Insufficient documentation

## 2020-08-24 LAB — CMP (CANCER CENTER ONLY)
ALT: 39 U/L (ref 0–44)
AST: 26 U/L (ref 15–41)
Albumin: 4.3 g/dL (ref 3.5–5.0)
Alkaline Phosphatase: 108 U/L (ref 38–126)
Anion gap: 8 (ref 5–15)
BUN: 18 mg/dL (ref 6–20)
CO2: 26 mmol/L (ref 22–32)
Calcium: 10 mg/dL (ref 8.9–10.3)
Chloride: 103 mmol/L (ref 98–111)
Creatinine: 1 mg/dL (ref 0.61–1.24)
GFR, Estimated: >60 mL/min (ref >60)
Glucose, Bld: 93 mg/dL (ref 70–99)
Potassium: 4.2 mmol/L (ref 3.5–5.1)
Sodium: 137 mmol/L (ref 135–145)
Total Bilirubin: 0.6 mg/dL (ref 0.3–1.2)
Total Protein: 7.1 g/dL (ref 6.5–8.1)

## 2020-08-24 LAB — CBC WITH DIFFERENTIAL (CANCER CENTER ONLY)
Abs Immature Granulocytes: 0.07 10*3/uL (ref 0.00–0.07)
Basophils Absolute: 0 10*3/uL (ref 0.0–0.1)
Basophils Relative: 0 %
Eosinophils Absolute: 0.3 10*3/uL (ref 0.0–0.5)
Eosinophils Relative: 3 %
HCT: 52.4 % — ABNORMAL HIGH (ref 39.0–52.0)
Hemoglobin: 17.5 g/dL — ABNORMAL HIGH (ref 13.0–17.0)
Immature Granulocytes: 1 %
Lymphocytes Relative: 35 %
Lymphs Abs: 3.5 10*3/uL (ref 0.7–4.0)
MCH: 26.5 pg (ref 26.0–34.0)
MCHC: 33.4 g/dL (ref 30.0–36.0)
MCV: 79.4 fL — ABNORMAL LOW (ref 80.0–100.0)
Monocytes Absolute: 0.8 10*3/uL (ref 0.1–1.0)
Monocytes Relative: 8 %
Neutro Abs: 5.3 10*3/uL (ref 1.7–7.7)
Neutrophils Relative %: 53 %
Platelet Count: 231 10*3/uL (ref 150–400)
RBC: 6.6 MIL/uL — ABNORMAL HIGH (ref 4.22–5.81)
RDW: 16.9 % — ABNORMAL HIGH (ref 11.5–15.5)
WBC Count: 10 10*3/uL (ref 4.0–10.5)
nRBC: 0 % (ref 0.0–0.2)

## 2020-08-24 LAB — IRON AND TIBC
Iron: 42 ug/dL — ABNORMAL LOW (ref 45–182)
Saturation Ratios: 10 % — ABNORMAL LOW (ref 17.9–39.5)
TIBC: 431 ug/dL (ref 250–450)
UIBC: 389 ug/dL

## 2020-08-24 LAB — FERRITIN: Ferritin: 14 ng/mL — ABNORMAL LOW (ref 24–336)

## 2020-08-24 NOTE — Progress Notes (Signed)
Hematology and Oncology Follow Up Visit  [name] [id number] 1985/11/20 35 y.o. 08/24/2020   Principle Diagnosis:  Secondary polycythemia - smoking JAK2 negative - 04/28/2014   Current Therapy:        Aspirin 162 mg by mouth daily Phlebotomy to maintain hematocrit below 48%   Interim History:  [name] is here today for follow-up and phlebotomy. Hct is up at 52.4%.  He is doing well and has no complaints at this time.  He did stop taking all of his medicines a couple months ago including aspirin. He will restart this.  No fever, chills, n/v, cough, rash, dizziness, SOB, chest pain, palpitations, abdominal pain or changes in bowel or bladder habits.  No swelling, tenderness, numbness or tingling in his extremities.  No falls or syncope.  He has a good appetite and is staying properly hydrated. His weight is stable at 239 lbs.  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   ECOG Performance Status: 1 - Symptomatic but completely ambulatory  Medications:  Allergies as of 08/24/2020   No Known Allergies      Medication List        Accurate as of August 24, 2020  1:14 PM. If you have any questions, ask your nurse or doctor.          aspirin 325 MG tablet Take 325 mg by mouth daily. Reported on 03/09/2015   cetirizine 10 MG tablet Commonly known as: ZYRTEC Take by mouth.   fluticasone 50 MCG/ACT nasal spray Commonly known as: FLONASE Place into the nose.   GaviLAX 17 GM/SCOOP powder Generic drug: polyethylene glycol powder TAKE 17 GRAMS MIXED WITH 8OZ WATER, JUICE, SODA, COFFEE OR TEA BY MOUTH ONE TIME DAILY FOR 30 DAYS   hydrochlorothiazide 12.5 MG capsule Commonly known as: MICROZIDE Take by mouth.   omeprazole 40 MG capsule Commonly known as: PRILOSEC Take 40 mg by mouth daily.        Allergies: No Known Allergies  Past Medical History, Surgical history, Social history, and Family History were reviewed and  updated.  Review of Systems: All other 10 point review of systems is negative.   Physical Exam:  vitals were not taken for this visit.   Wt Readings from Last 3 Encounters:  06/15/20 239 lb (108.4 kg)  04/13/20 230 lb 12.8 oz (104.7 kg)  02/09/20 224 lb 1.9 oz (101.7 kg)    Ocular: Sclerae unicteric, pupils equal, round and reactive to light Ear-nose-throat: Oropharynx clear, dentition fair Lymphatic: No cervical or supraclavicular adenopathy Lungs no rales or rhonchi, good excursion bilaterally Heart regular rate and rhythm, no murmur appreciated Abd soft, nontender, positive bowel sounds MSK no focal spinal tenderness, no joint edema Neuro: non-focal, well-oriented, appropriate affect Breasts: Deferred   Lab Results  Component Value Date   WBC 10.0 08/24/2020   HGB 17.5 (H) 08/24/2020   HCT 52.4 (H) 08/24/2020   MCV 79.4 (L) 08/24/2020   PLT 231 08/24/2020   Lab Results  Component Value Date   FERRITIN 16 (L) 06/15/2020   IRON 40 (L) 06/15/2020   TIBC 393 06/15/2020   UIBC 353 06/15/2020   IRONPCTSAT 10 (L) 06/15/2020   Lab Results  Component Value Date   RETICCTPCT 1.7 04/28/2014   RBC 6.60 (H) 08/24/2020   RETICCTABS 103.2 04/28/2014   No results found for: KPAFRELGTCHN, LAMBDASER, KAPLAMBRATIO No results found for: IGGSERUM, IGA, IGMSERUM No results found for: TOTALPROTELP, ALBUMINELP, A1GS, A2GS, BETS,  Truddie Crumble, SPEI   Chemistry      Component Value Date/Time   NA 136 06/15/2020 1257   NA 140 12/12/2016 1420   NA 137 08/17/2015 1305   K 4.1 06/15/2020 1257   K 3.9 12/12/2016 1420   K 4.4 08/17/2015 1305   CL 103 06/15/2020 1257   CL 106 12/12/2016 1420   CO2 26 06/15/2020 1257   CO2 25 12/12/2016 1420   CO2 21 (L) 08/17/2015 1305   BUN 16 06/15/2020 1257   BUN 16 12/12/2016 1420   BUN 16.2 08/17/2015 1305   CREATININE 1.01 06/15/2020 1257   CREATININE 1.4 (H) 12/12/2016 1420   CREATININE 1.0 08/17/2015 1305      Component Value  Date/Time   CALCIUM 9.8 06/15/2020 1257   CALCIUM 9.5 12/12/2016 1420   CALCIUM 9.3 08/17/2015 1305   ALKPHOS 89 06/15/2020 1257   ALKPHOS 112 (H) 12/12/2016 1420   ALKPHOS 126 08/17/2015 1305   AST 21 06/15/2020 1257   AST 25 08/17/2015 1305   ALT 32 06/15/2020 1257   ALT 48 (H) 12/12/2016 1420   ALT 40 08/17/2015 1305   BILITOT 0.6 06/15/2020 1257   BILITOT 0.56 08/17/2015 1305       Impression and Plan: [name] is a pleasant 35 yo [place] gentleman with secondary polycythemia due to smoking. He also has chronic iron deficiency secondary to phlebotomies. Phlebotomy today for Hct 52%.  He will restart his full dose aspirin today.  Follow-up in 2 months.  He can contact our office with any questions or concerns.    Laverna Peace, NP 7/20/20221:14 PM

## 2020-08-24 NOTE — Progress Notes (Signed)
Ricke Hey presents today for phlebotomy per MD orders. Phlebotomy procedure started at 1336 and ended at 1341. 540 grams removed via 16 gauge needle to R AC using phlebotomy kit. Patient observed for 30 minutes after procedure without any incident. Patient tolerated procedure well. IV needle removed intact.

## 2020-08-24 NOTE — Patient Instructions (Signed)
Therapeutic Phlebotomy Therapeutic phlebotomy is the planned removal of blood from a person's body for the purpose of treating a medical condition. The procedure is similar to donating blood. Usually, about a pint (470 mL, or 0.47 L) of blood is removed.The average adult has 9-12 pints (4.3-5.7 L) of blood in the body. Therapeutic phlebotomy may be used to treat the following medical conditions: Hemochromatosis. This is a condition in which the blood contains too much iron. Polycythemia vera. This is a condition in which the blood contains too many red blood cells. Porphyria cutanea tarda. This is a disease in which an important part of hemoglobin is not made properly. It results in the buildup of abnormal amounts of porphyrins in the body. Sickle cell disease. This is a condition in which the red blood cells form an abnormal crescent shape rather than a round shape. Tell a health care provider about: Any allergies you have. All medicines you are taking, including vitamins, herbs, eye drops, creams, and over-the-counter medicines. Any problems you or family members have had with anesthetic medicines. Any blood disorders you have. Any surgeries you have had. Any medical conditions you have. Whether you are pregnant or may be pregnant. What are the risks? Generally, this is a safe procedure. However, problems may occur, including: Nausea or light-headedness. Low blood pressure (hypotension). Soreness, bleeding, swelling, or bruising at the needle insertion site. Infection. What happens before the procedure? Follow instructions from your health care provider about eating or drinking restrictions. Ask your health care provider about: Changing or stopping your regular medicines. This is especially important if you are taking diabetes medicines or blood thinners (anticoagulants). Taking medicines such as aspirin and ibuprofen. These medicines can thin your blood. Do not take these medicines unless  your health care provider tells you to take them. Taking over-the-counter medicines, vitamins, herbs, and supplements. Wear clothing with sleeves that can be raised above the elbow. Plan to have someone take you home from the hospital or clinic. You may have a blood sample taken. Your blood pressure, pulse rate, and breathing rate will be measured. What happens during the procedure?  To lower your risk of infection: Your health care team will wash or sanitize their hands. Your skin will be cleaned with an antiseptic. You may be given a medicine to numb the area (local anesthetic). A tourniquet will be placed on your arm. A needle will be inserted into one of your veins. Tubing and a collection bag will be attached to that needle. Blood will flow through the needle and tubing into the collection bag. The collection bag will be placed lower than your arm to allow gravity to help the flow of blood into the bag. You may be asked to open and close your hand slowly and continually during the entire collection. After the specified amount of blood has been removed from your body, the collection bag and tubing will be clamped. The needle will be removed from your vein. Pressure will be held on the site of the needle insertion to stop the bleeding. A bandage (dressing) will be placed over the needle insertion site. The procedure may vary among health care providers and hospitals. What happens after the procedure? Your blood pressure, pulse rate, and breathing rate will be measured after the procedure. You will be encouraged to drink fluids. Your recovery will be assessed and monitored. You can return to your normal activities as told by your health care provider. Summary Therapeutic phlebotomy is the planned removal of   blood from a person's body for the purpose of treating a medical condition. Therapeutic phlebotomy may be used to treat hemochromatosis, polycythemia vera, porphyria cutanea tarda,  or sickle cell disease. In the procedure, a needle is inserted and about a pint (470 mL, or 0.47 L) of blood is removed. The average adult has 9-12 pints (4.3-5.7 L) of blood in the body. This is generally a safe procedure, but it can sometimes cause problems such as nausea, light-headedness, or low blood pressure (hypotension). This information is not intended to replace advice given to you by your health care provider. Make sure you discuss any questions you have with your healthcare provider. Document Revised: 02/07/2017 Document Reviewed: 02/07/2017 Elsevier Patient Education  2022 [employer]  

## 2020-08-25 ENCOUNTER — Telehealth: Payer: Self-pay | Admitting: *Deleted

## 2020-08-25 NOTE — Telephone Encounter (Signed)
Per 08/24/20 los - called and lvm of upcoming appointment - requested callback to confirm - mailed calendar 

## 2020-10-25 ENCOUNTER — Other Ambulatory Visit: Payer: Self-pay

## 2020-10-25 ENCOUNTER — Inpatient Hospital Stay: Payer: 59

## 2020-10-25 ENCOUNTER — Encounter: Payer: Self-pay | Admitting: Hematology & Oncology

## 2020-10-25 ENCOUNTER — Inpatient Hospital Stay (HOSPITAL_BASED_OUTPATIENT_CLINIC_OR_DEPARTMENT_OTHER): Payer: 59 | Admitting: Hematology & Oncology

## 2020-10-25 ENCOUNTER — Inpatient Hospital Stay: Payer: 59 | Attending: Family

## 2020-10-25 ENCOUNTER — Telehealth: Payer: Self-pay | Admitting: *Deleted

## 2020-10-25 VITALS — BP 124/89 | HR 66 | Temp 97.5°F | Resp 18 | Wt 233.8 lb

## 2020-10-25 DIAGNOSIS — D751 Secondary polycythemia: Secondary | ICD-10-CM

## 2020-10-25 DIAGNOSIS — F17210 Nicotine dependence, cigarettes, uncomplicated: Secondary | ICD-10-CM | POA: Insufficient documentation

## 2020-10-25 DIAGNOSIS — D500 Iron deficiency anemia secondary to blood loss (chronic): Secondary | ICD-10-CM

## 2020-10-25 DIAGNOSIS — Z7982 Long term (current) use of aspirin: Secondary | ICD-10-CM | POA: Insufficient documentation

## 2020-10-25 LAB — CMP (CANCER CENTER ONLY)
ALT: 32 U/L (ref 0–44)
AST: 22 U/L (ref 15–41)
Albumin: 4.2 g/dL (ref 3.5–5.0)
Alkaline Phosphatase: 108 U/L (ref 38–126)
Anion gap: 7 (ref 5–15)
BUN: 22 mg/dL — ABNORMAL HIGH (ref 6–20)
CO2: 28 mmol/L (ref 22–32)
Calcium: 9.9 mg/dL (ref 8.9–10.3)
Chloride: 104 mmol/L (ref 98–111)
Creatinine: 1.13 mg/dL (ref 0.61–1.24)
GFR, Estimated: >60 mL/min
Glucose, Bld: 90 mg/dL (ref 70–99)
Potassium: 4.4 mmol/L (ref 3.5–5.1)
Sodium: 139 mmol/L (ref 135–145)
Total Bilirubin: 0.7 mg/dL (ref 0.3–1.2)
Total Protein: 7 g/dL (ref 6.5–8.1)

## 2020-10-25 LAB — CBC WITH DIFFERENTIAL (CANCER CENTER ONLY)
Abs Immature Granulocytes: 0.02 K/uL (ref 0.00–0.07)
Basophils Absolute: 0 K/uL (ref 0.0–0.1)
Basophils Relative: 1 %
Eosinophils Absolute: 0.3 K/uL (ref 0.0–0.5)
Eosinophils Relative: 4 %
HCT: 49.1 % (ref 39.0–52.0)
Hemoglobin: 16.3 g/dL (ref 13.0–17.0)
Immature Granulocytes: 0 %
Lymphocytes Relative: 33 %
Lymphs Abs: 2.4 K/uL (ref 0.7–4.0)
MCH: 26.6 pg (ref 26.0–34.0)
MCHC: 33.2 g/dL (ref 30.0–36.0)
MCV: 80.1 fL (ref 80.0–100.0)
Monocytes Absolute: 0.6 K/uL (ref 0.1–1.0)
Monocytes Relative: 7 %
Neutro Abs: 4.1 K/uL (ref 1.7–7.7)
Neutrophils Relative %: 55 %
Platelet Count: 247 K/uL (ref 150–400)
RBC: 6.13 MIL/uL — ABNORMAL HIGH (ref 4.22–5.81)
RDW: 15.1 % (ref 11.5–15.5)
WBC Count: 7.4 K/uL (ref 4.0–10.5)
nRBC: 0 % (ref 0.0–0.2)

## 2020-10-25 NOTE — Progress Notes (Signed)
Hematology and Oncology Follow Up Visit  Austin Poole [id number] 08-23-1985 35 y.o. 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  He comes in with his wife.  I am not seeing her for quite a while.  They have been both busy.  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  He is exercising.  He goes to [employer].  He enjoys this.  He is still smoking.  He smokes a couple cigarettes a day.  He is taking his aspirin.  I told him that this is incredibly important to take.  He has had no change in bowel or bladder habits.  There is been no issues with COVID.  He has had no problems with rashes.  There is been no headache.  He has had no leg swelling.  His iron studies back in July showed a ferritin of 14 with an iron saturation of 10%.  Overall, I would say his performance status is ECOG 0.    Medications:  Allergies as of 10/25/2020   No Known Allergies      Medication List        Accurate as of October 25, 2020  1:25 PM. If you have any questions, ask your nurse or doctor.          aspirin 325 MG tablet Take 325 mg by mouth daily. Reported on 03/09/2015   cetirizine 10 MG tablet Commonly known as: ZYRTEC Take by mouth.   fluticasone 50 MCG/ACT nasal spray Commonly known as: FLONASE Place into the nose.   GaviLAX 17 GM/SCOOP powder Generic drug: polyethylene glycol powder TAKE 17 GRAMS MIXED WITH 8OZ WATER, JUICE, SODA, COFFEE OR TEA BY MOUTH ONE TIME DAILY FOR 30 DAYS   hydrochlorothiazide 12.5 MG capsule Commonly known as: MICROZIDE Take by mouth.   omeprazole 40 MG capsule Commonly known as: PRILOSEC Take 40 mg by mouth daily.        Allergies: No Known  Allergies  Past Medical History, Surgical history, Social history, and Family History were reviewed and updated.  Review of Systems: Review of Systems  Constitutional: Negative.   HENT: Negative.    Eyes: Negative.   Respiratory: Negative.    Cardiovascular: Negative.   Gastrointestinal: Negative.   Genitourinary: Negative.   Musculoskeletal: Negative.   Skin: Negative.   Neurological: Negative.   Endo/Heme/Allergies: Negative.   Psychiatric/Behavioral: Negative.      Physical Exam:  weight is 233 lb 12 oz (106 kg). His oral temperature is 97.5 F (36.4 C) (abnormal). His blood pressure is 124/89 and his pulse is 66. His respiration is 18 and oxygen saturation is 100%.   Wt Readings from Last 3 Encounters:  10/25/20 233 lb 12 oz (106 kg)  08/24/20 239 lb 1.3 oz (108.4 kg)  06/15/20 239 lb (108.4 kg)    Physical Exam Vitals reviewed.  HENT:     Head: Normocephalic and atraumatic.  Eyes:     Pupils: Pupils are equal, round, and reactive to light.  Cardiovascular:     Rate and Rhythm: Normal rate and regular rhythm.     Heart sounds: Normal heart sounds.  Pulmonary:     Effort: Pulmonary effort is  normal.     Breath sounds: Normal breath sounds.  Abdominal:     General: Bowel sounds are normal.     Palpations: Abdomen is soft.  Musculoskeletal:        General: No tenderness or deformity. Normal range of motion.     Cervical back: Normal range of motion.  Lymphadenopathy:     Cervical: No cervical adenopathy.  Skin:    General: Skin is warm and dry.     Findings: No erythema or rash.  Neurological:     Mental Status: He is alert and oriented to person, place, and time.  Psychiatric:        Behavior: Behavior normal.        Thought Content: Thought content normal.        Judgment: Judgment normal.    Lab Results  Component Value Date   WBC 7.4 10/25/2020   HGB 16.3 10/25/2020   HCT 49.1 10/25/2020   MCV 80.1 10/25/2020   PLT 247 10/25/2020   Lab Results   Component Value Date   FERRITIN 14 (L) 08/24/2020   IRON 42 (L) 08/24/2020   TIBC 431 08/24/2020   UIBC 389 08/24/2020   IRONPCTSAT 10 (L) 08/24/2020   Lab Results  Component Value Date   RETICCTPCT 1.7 04/28/2014   RBC 6.13 (H) 10/25/2020   RETICCTABS 103.2 04/28/2014   No results found for: KPAFRELGTCHN, LAMBDASER, KAPLAMBRATIO No results found for: Kandis Cocking, IGMSERUM No results found for: Odetta Pink, SPEI   Chemistry      Component Value Date/Time   NA 139 10/25/2020 1153   NA 140 12/12/2016 1420   NA 137 08/17/2015 1305   K 4.4 10/25/2020 1153   K 3.9 12/12/2016 1420   K 4.4 08/17/2015 1305   CL 104 10/25/2020 1153   CL 106 12/12/2016 1420   CO2 28 10/25/2020 1153   CO2 25 12/12/2016 1420   CO2 21 (L) 08/17/2015 1305   BUN 22 (H) 10/25/2020 1153   BUN 16 12/12/2016 1420   BUN 16.2 08/17/2015 1305   CREATININE 1.13 10/25/2020 1153   CREATININE 1.4 (H) 12/12/2016 1420   CREATININE 1.0 08/17/2015 1305      Component Value Date/Time   CALCIUM 9.9 10/25/2020 1153   CALCIUM 9.5 12/12/2016 1420   CALCIUM 9.3 08/17/2015 1305   ALKPHOS 108 10/25/2020 1153   ALKPHOS 112 (H) 12/12/2016 1420   ALKPHOS 126 08/17/2015 1305   AST 22 10/25/2020 1153   AST 25 08/17/2015 1305   ALT 32 10/25/2020 1153   ALT 48 (H) 12/12/2016 1420   ALT 40 08/17/2015 1305   BILITOT 0.7 10/25/2020 1153   BILITOT 0.56 08/17/2015 1305       Impression and Plan: Austin Poole is a pleasant 35 yo [place] gentleman with secondary polycythemia due to smoking.  Again, he is try to cut back on smoking.  We will hold off on a phlebotomy today.  He is quite busy at work so he really cannot stay for a phlebotomy.  We will plan to get him back in another month or so.  Hopefully if we phlebotomize and then, we can then get him through all of the holiday season.     Volanda Napoleon, MD 9/20/20221:25 PM

## 2020-10-25 NOTE — Telephone Encounter (Signed)
Per 10/25/20 los - gave upcoming appointments - confirmed

## 2020-10-26 LAB — IRON AND TIBC
Iron: 36 ug/dL — ABNORMAL LOW (ref 42–163)
Saturation Ratios: 9 % — ABNORMAL LOW (ref 20–55)
TIBC: 383 ug/dL (ref 202–409)
UIBC: 346 ug/dL (ref 117–376)

## 2020-10-26 LAB — FERRITIN: Ferritin: 16 ng/mL — ABNORMAL LOW (ref 24–336)

## 2020-12-07 ENCOUNTER — Other Ambulatory Visit: Payer: Self-pay

## 2020-12-07 ENCOUNTER — Inpatient Hospital Stay: Payer: 59 | Attending: Family

## 2020-12-07 ENCOUNTER — Inpatient Hospital Stay (HOSPITAL_BASED_OUTPATIENT_CLINIC_OR_DEPARTMENT_OTHER): Payer: 59 | Admitting: Family

## 2020-12-07 ENCOUNTER — Inpatient Hospital Stay: Payer: 59

## 2020-12-07 ENCOUNTER — Encounter: Payer: Self-pay | Admitting: Family

## 2020-12-07 VITALS — BP 132/85 | HR 65 | Temp 97.7°F | Resp 17 | Wt 239.0 lb

## 2020-12-07 DIAGNOSIS — Z7982 Long term (current) use of aspirin: Secondary | ICD-10-CM | POA: Insufficient documentation

## 2020-12-07 DIAGNOSIS — Z79899 Other long term (current) drug therapy: Secondary | ICD-10-CM | POA: Insufficient documentation

## 2020-12-07 DIAGNOSIS — F17210 Nicotine dependence, cigarettes, uncomplicated: Secondary | ICD-10-CM | POA: Diagnosis not present

## 2020-12-07 DIAGNOSIS — D751 Secondary polycythemia: Secondary | ICD-10-CM | POA: Diagnosis not present

## 2020-12-07 DIAGNOSIS — D500 Iron deficiency anemia secondary to blood loss (chronic): Secondary | ICD-10-CM

## 2020-12-07 LAB — CMP (CANCER CENTER ONLY)
ALT: 36 U/L (ref 0–44)
AST: 22 U/L (ref 15–41)
Albumin: 4.3 g/dL (ref 3.5–5.0)
Alkaline Phosphatase: 111 U/L (ref 38–126)
Anion gap: 9 (ref 5–15)
BUN: 17 mg/dL (ref 6–20)
CO2: 23 mmol/L (ref 22–32)
Calcium: 9.8 mg/dL (ref 8.9–10.3)
Chloride: 104 mmol/L (ref 98–111)
Creatinine: 0.99 mg/dL (ref 0.61–1.24)
GFR, Estimated: >60 mL/min (ref >60)
Glucose, Bld: 90 mg/dL (ref 70–99)
Potassium: 4 mmol/L (ref 3.5–5.1)
Sodium: 136 mmol/L (ref 135–145)
Total Bilirubin: 0.6 mg/dL (ref 0.3–1.2)
Total Protein: 6.9 g/dL (ref 6.5–8.1)

## 2020-12-07 LAB — CBC WITH DIFFERENTIAL (CANCER CENTER ONLY)
Abs Immature Granulocytes: 0.05 10*3/uL (ref 0.00–0.07)
Basophils Absolute: 0 10*3/uL (ref 0.0–0.1)
Basophils Relative: 0 %
Eosinophils Absolute: 0.3 10*3/uL (ref 0.0–0.5)
Eosinophils Relative: 3 %
HCT: 51.3 % (ref 39.0–52.0)
Hemoglobin: 17 g/dL (ref 13.0–17.0)
Immature Granulocytes: 1 %
Lymphocytes Relative: 34 %
Lymphs Abs: 3.1 10*3/uL (ref 0.7–4.0)
MCH: 26.3 pg (ref 26.0–34.0)
MCHC: 33.1 g/dL (ref 30.0–36.0)
MCV: 79.3 fL — ABNORMAL LOW (ref 80.0–100.0)
Monocytes Absolute: 0.9 10*3/uL (ref 0.1–1.0)
Monocytes Relative: 10 %
Neutro Abs: 4.6 10*3/uL (ref 1.7–7.7)
Neutrophils Relative %: 52 %
Platelet Count: 229 10*3/uL (ref 150–400)
RBC: 6.47 MIL/uL — ABNORMAL HIGH (ref 4.22–5.81)
RDW: 15.4 % (ref 11.5–15.5)
WBC Count: 9 10*3/uL (ref 4.0–10.5)
nRBC: 0 % (ref 0.0–0.2)

## 2020-12-07 LAB — RETICULOCYTES
Immature Retic Fract: 16.1 % — ABNORMAL HIGH (ref 2.3–15.9)
RBC.: 6.42 MIL/uL — ABNORMAL HIGH (ref 4.22–5.81)
Retic Count, Absolute: 107.2 10*3/uL (ref 19.0–186.0)
Retic Ct Pct: 1.7 % (ref 0.4–3.1)

## 2020-12-07 NOTE — Progress Notes (Signed)
Hematology and Oncology Follow Up Visit  Austin Poole [id number] Aug 19, 1985 35 y.o. 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 is here today with his wife for follow-up and phlebotomy. He is doing well and has no complaints at this time.  He notes that he will sometimes have a little chest tightness after phlebotomy. He will increase his fluid intake after.  He has GERD and was having trouble tolerating aspirin. He will contact his PCP to request refill on Omeprazole and also try taking 1 enteric coated baby aspirin daily with food.  No fever, chills, n/v, cough, rash, dizziness, SOB, chest pain, palpitations, abdominal pain or changes in bowel or bladder habits.  No bruising or petechiae.  No swelling, tenderness, numbness or tingling in his extremities.  No falls or syncope reported.  He is eating well and weight is stable at 239 lbs.  He exercises regularly at a gym.   ECOG Performance Status: 1 - Symptomatic but completely ambulatory  Medications:  Allergies as of 12/07/2020   No Known Allergies      Medication List        Accurate as of December 07, 2020  2:03 PM. If you have any questions, ask your nurse or doctor.          aspirin 325 MG tablet Take 325 mg by mouth daily. Reported on 03/09/2015   cetirizine 10 MG tablet Commonly known as: ZYRTEC Take by mouth.   fluticasone 50 MCG/ACT nasal spray Commonly known as: FLONASE Place into the nose.   GaviLAX 17 GM/SCOOP powder Generic drug: polyethylene glycol powder TAKE 17 GRAMS MIXED WITH 8OZ WATER, JUICE, SODA, COFFEE OR TEA BY MOUTH ONE TIME DAILY FOR 30 DAYS   hydrochlorothiazide 12.5 MG capsule Commonly known as: MICROZIDE Take by mouth.   omeprazole 40 MG  capsule Commonly known as: PRILOSEC Take 40 mg by mouth daily.        Allergies: No Known Allergies  Past Medical History, Surgical history, Social history, and Family History were reviewed and updated.  Review of Systems: All other 10 point review of systems is negative.   Physical Exam:  vitals were not taken for this visit.   Wt Readings from Last 3 Encounters:  10/25/20 233 lb 12 oz (106 kg)  08/24/20 239 lb 1.3 oz (108.4 kg)  06/15/20 239 lb (108.4 kg)    Ocular: Sclerae unicteric, pupils equal, round and reactive to light Ear-nose-throat: Oropharynx clear, dentition fair Lymphatic: No cervical or supraclavicular adenopathy Lungs no rales or rhonchi, good excursion bilaterally Heart regular rate and rhythm, no murmur appreciated Abd soft, nontender, positive bowel sounds MSK no focal spinal tenderness, no joint edema Neuro: non-focal, well-oriented, appropriate affect Breasts: Deferred   Lab Results  Component Value Date   WBC 9.0 12/07/2020   HGB 17.0 12/07/2020   HCT 51.3 12/07/2020   MCV 79.3 (L) 12/07/2020   PLT 229 12/07/2020   Lab Results  Component Value Date   FERRITIN 16 (L) 10/25/2020   IRON 36 (L) 10/25/2020   TIBC 383 10/25/2020   UIBC 346 10/25/2020   IRONPCTSAT 9 (L) 10/25/2020   Lab Results  Component Value Date   RETICCTPCT 1.7 12/07/2020   RBC 6.47 (  H) 12/07/2020   RBC 6.42 (H) 12/07/2020   RETICCTABS 103.2 04/28/2014   No results found for: KPAFRELGTCHN, LAMBDASER, KAPLAMBRATIO No results found for: Kandis Cocking, IGMSERUM No results found for: Odetta Pink, SPEI   Chemistry      Component Value Date/Time   NA 139 10/25/2020 1153   NA 140 12/12/2016 1420   NA 137 08/17/2015 1305   K 4.4 10/25/2020 1153   K 3.9 12/12/2016 1420   K 4.4 08/17/2015 1305   CL 104 10/25/2020 1153   CL 106 12/12/2016 1420   CO2 28 10/25/2020 1153   CO2 25 12/12/2016 1420   CO2 21 (L)  08/17/2015 1305   BUN 22 (H) 10/25/2020 1153   BUN 16 12/12/2016 1420   BUN 16.2 08/17/2015 1305   CREATININE 1.13 10/25/2020 1153   CREATININE 1.4 (H) 12/12/2016 1420   CREATININE 1.0 08/17/2015 1305      Component Value Date/Time   CALCIUM 9.9 10/25/2020 1153   CALCIUM 9.5 12/12/2016 1420   CALCIUM 9.3 08/17/2015 1305   ALKPHOS 108 10/25/2020 1153   ALKPHOS 112 (H) 12/12/2016 1420   ALKPHOS 126 08/17/2015 1305   AST 22 10/25/2020 1153   AST 25 08/17/2015 1305   ALT 32 10/25/2020 1153   ALT 48 (H) 12/12/2016 1420   ALT 40 08/17/2015 1305   BILITOT 0.7 10/25/2020 1153   BILITOT 0.56 08/17/2015 1305       Impression and Plan: [name] is a pleasant 35 yo [place] gentleman with secondary polycythemia due to smoking.  We will proceed with phlebotomy today for Hct 51%.  He will try restarting 1 enteric baby aspirin daily.  Follow-up in 8 weeks.  Iron studies pending. Iron saturation at last visit was 9% and ferritin 16.  They will contact our office with any questions or concerns.   Lottie Dawson, NP 11/2/20222:03 PM

## 2020-12-07 NOTE — Progress Notes (Signed)
Austin Poole presents today for phlebotomy per MD orders. Phlebotomy procedure started at 1435 and ended at 1446. 520 grams removed via 18 gauge needle to right AC. Patient observed for 30 minutes after procedure without any incident. Patient tolerated procedure well. IV needle removed intact. Pt declined to stay for post procedure observation period. Pt stated he has tolerated procedure multiple times prior without difficulty. Pt aware to call clinic with any questions or concerns. Pt verbalized understanding and had no further questions.

## 2020-12-07 NOTE — Patient Instructions (Signed)

## 2020-12-08 LAB — IRON AND TIBC
Iron: 55 ug/dL (ref 42–163)
Saturation Ratios: 14 % — ABNORMAL LOW (ref 20–55)
TIBC: 384 ug/dL (ref 202–409)
UIBC: 329 ug/dL (ref 117–376)

## 2020-12-08 LAB — FERRITIN: Ferritin: 14 ng/mL — ABNORMAL LOW (ref 24–336)

## 2020-12-09 ENCOUNTER — Telehealth: Payer: Self-pay | Admitting: *Deleted

## 2020-12-09 NOTE — Telephone Encounter (Signed)
Per 12/07/20 los - called and gave upcoming appointments - confirmed 

## 2021-02-02 ENCOUNTER — Encounter: Payer: Self-pay | Admitting: Family

## 2021-02-07 ENCOUNTER — Inpatient Hospital Stay: Payer: 59

## 2021-02-07 ENCOUNTER — Inpatient Hospital Stay: Payer: 59 | Attending: Family

## 2021-02-07 ENCOUNTER — Encounter: Payer: Self-pay | Admitting: Family

## 2021-02-07 ENCOUNTER — Inpatient Hospital Stay (HOSPITAL_BASED_OUTPATIENT_CLINIC_OR_DEPARTMENT_OTHER): Payer: 59 | Admitting: Family

## 2021-02-07 ENCOUNTER — Other Ambulatory Visit: Payer: Self-pay

## 2021-02-07 VITALS — BP 129/82 | HR 76 | Temp 98.1°F | Resp 18 | Ht 69.0 in | Wt 238.0 lb

## 2021-02-07 DIAGNOSIS — D500 Iron deficiency anemia secondary to blood loss (chronic): Secondary | ICD-10-CM

## 2021-02-07 DIAGNOSIS — D751 Secondary polycythemia: Secondary | ICD-10-CM | POA: Insufficient documentation

## 2021-02-07 DIAGNOSIS — K219 Gastro-esophageal reflux disease without esophagitis: Secondary | ICD-10-CM | POA: Diagnosis not present

## 2021-02-07 DIAGNOSIS — Z79899 Other long term (current) drug therapy: Secondary | ICD-10-CM | POA: Diagnosis not present

## 2021-02-07 DIAGNOSIS — Z7982 Long term (current) use of aspirin: Secondary | ICD-10-CM | POA: Diagnosis not present

## 2021-02-07 DIAGNOSIS — F17210 Nicotine dependence, cigarettes, uncomplicated: Secondary | ICD-10-CM | POA: Insufficient documentation

## 2021-02-07 LAB — CBC WITH DIFFERENTIAL (CANCER CENTER ONLY)
Abs Immature Granulocytes: 0.08 10*3/uL — ABNORMAL HIGH (ref 0.00–0.07)
Basophils Absolute: 0 10*3/uL (ref 0.0–0.1)
Basophils Relative: 1 %
Eosinophils Absolute: 0.4 10*3/uL (ref 0.0–0.5)
Eosinophils Relative: 5 %
HCT: 49.1 % (ref 39.0–52.0)
Hemoglobin: 16.2 g/dL (ref 13.0–17.0)
Immature Granulocytes: 1 %
Lymphocytes Relative: 32 %
Lymphs Abs: 2.3 10*3/uL (ref 0.7–4.0)
MCH: 26.3 pg (ref 26.0–34.0)
MCHC: 33 g/dL (ref 30.0–36.0)
MCV: 79.7 fL — ABNORMAL LOW (ref 80.0–100.0)
Monocytes Absolute: 0.6 10*3/uL (ref 0.1–1.0)
Monocytes Relative: 9 %
Neutro Abs: 3.7 10*3/uL (ref 1.7–7.7)
Neutrophils Relative %: 52 %
Platelet Count: 247 10*3/uL (ref 150–400)
RBC: 6.16 MIL/uL — ABNORMAL HIGH (ref 4.22–5.81)
RDW: 14.9 % (ref 11.5–15.5)
WBC Count: 7.2 10*3/uL (ref 4.0–10.5)
nRBC: 0 % (ref 0.0–0.2)

## 2021-02-07 LAB — IRON AND IRON BINDING CAPACITY (CC-WL,HP ONLY)
Iron: 61 ug/dL (ref 45–182)
Saturation Ratios: 15 % — ABNORMAL LOW (ref 17.9–39.5)
TIBC: 402 ug/dL (ref 250–450)
UIBC: 341 ug/dL (ref 117–376)

## 2021-02-07 LAB — CMP (CANCER CENTER ONLY)
ALT: 30 U/L (ref 0–44)
AST: 19 U/L (ref 15–41)
Albumin: 4 g/dL (ref 3.5–5.0)
Alkaline Phosphatase: 97 U/L (ref 38–126)
Anion gap: 8 (ref 5–15)
BUN: 15 mg/dL (ref 6–20)
CO2: 25 mmol/L (ref 22–32)
Calcium: 9.4 mg/dL (ref 8.9–10.3)
Chloride: 105 mmol/L (ref 98–111)
Creatinine: 1.05 mg/dL (ref 0.61–1.24)
GFR, Estimated: >60 mL/min (ref >60)
Glucose, Bld: 109 mg/dL — ABNORMAL HIGH (ref 70–99)
Potassium: 3.9 mmol/L (ref 3.5–5.1)
Sodium: 138 mmol/L (ref 135–145)
Total Bilirubin: 0.8 mg/dL (ref 0.3–1.2)
Total Protein: 6.3 g/dL — ABNORMAL LOW (ref 6.5–8.1)

## 2021-02-07 LAB — RETICULOCYTES
Immature Retic Fract: 10.4 % (ref 2.3–15.9)
RBC.: 6.16 MIL/uL — ABNORMAL HIGH (ref 4.22–5.81)
Retic Count, Absolute: 97.3 10*3/uL (ref 19.0–186.0)
Retic Ct Pct: 1.6 % (ref 0.4–3.1)

## 2021-02-07 LAB — FERRITIN: Ferritin: 14 ng/mL — ABNORMAL LOW (ref 24–336)

## 2021-02-07 MED ORDER — OMEPRAZOLE 20 MG PO CPDR: 20.0000 mg | DELAYED_RELEASE_CAPSULE | Freq: Every day | ORAL | 0 refills | Status: DC

## 2021-02-07 NOTE — Progress Notes (Signed)
Hematology and Oncology Follow Up Visit  Austin Poole [id number] 08/31/1985 36 y.o. 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 He is doing well and has no complaints at this time.  Hct is 49%. He is down to smoking 2-3 cigarettes a day.  No issue with fatigue. He goes to the gym regularly.  No blood loss, abnormal bruising or petechiae noted.  He has occasional GERD with spicy foods.  No fever, chills, n/v, cough, rash, dizziness, SOB, chest pain, palpitations, abdominal pain or changes on bowel or bladder habits at this time.  No swelling, tenderness, numbness or tingling in Austin extremities.  No falls or syncope.  He is eating well and staying hydrated throughout the day. Austin weight is stable at 238 lbs  ECOG Performance Status: 1 - Symptomatic but completely ambulatory  Medications:  Allergies as of 02/07/2021   No Known Allergies      Medication List        Accurate as of February 07, 2021 10:54 AM. If you have any questions, ask your nurse or doctor.          STOP taking these medications    hydrochlorothiazide 12.5 MG capsule Commonly known as: MICROZIDE Stopped by: Lottie Dawson, NP       TAKE these medications    aspirin 81 MG chewable tablet Chew by mouth. What changed: Another medication with the same name was removed. Continue taking this medication, and follow the directions you see here. Changed by: Lottie Dawson, NP   cetirizine 10 MG tablet Commonly known as: ZYRTEC Take by mouth.   fluticasone 50 MCG/ACT nasal spray Commonly known as: FLONASE Place into the nose.   GaviLAX 17 GM/SCOOP powder Generic drug: polyethylene glycol powder TAKE 17 GRAMS MIXED  WITH 8OZ WATER, JUICE, SODA, COFFEE OR TEA BY MOUTH ONE TIME DAILY FOR 30 DAYS   lisinopril-hydrochlorothiazide 20-12.5 MG tablet Commonly known as: ZESTORETIC Take 1 tablet by mouth daily.   omeprazole 20 MG capsule Commonly known as: PRILOSEC Take 20 mg by mouth daily. What changed: Another medication with the same name was removed. Continue taking this medication, and follow the directions you see here. Changed by: Lottie Dawson, NP        Allergies: No Known Allergies  Past Medical History, Surgical history, Social history, and Family History were reviewed and updated.  Review of Systems: All other 10 point review of systems is negative.   Physical Exam:  height is 5\' 9"  (1.753 m) and weight is 238 lb (108 kg). Austin oral temperature is 98.1 F (36.7 C). Austin blood pressure is 129/82 and Austin pulse is 76. Austin respiration is 18 and oxygen saturation is 100%.   Wt Readings from Last 3 Encounters:  02/07/21 238 lb (108 kg)  12/07/20 239 lb (108.4 kg)  10/25/20 233 lb 12 oz (106 kg)    Ocular: Sclerae unicteric, pupils equal, round and reactive to light Ear-nose-throat: Oropharynx clear, dentition fair Lymphatic: No cervical or supraclavicular adenopathy Lungs no rales or rhonchi, good excursion bilaterally Heart regular rate and rhythm, no murmur appreciated Abd soft, nontender, positive bowel sounds MSK no focal spinal tenderness, no joint  edema Neuro: non-focal, well-oriented, appropriate affect Breasts: Deferred   Lab Results  Component Value Date   WBC 7.2 02/07/2021   HGB 16.2 02/07/2021   HCT 49.1 02/07/2021   MCV 79.7 (L) 02/07/2021   PLT 247 02/07/2021   Lab Results  Component Value Date   FERRITIN 14 (L) 12/07/2020   IRON 55 12/07/2020   TIBC 384 12/07/2020   UIBC 329 12/07/2020   IRONPCTSAT 14 (L) 12/07/2020   Lab Results  Component Value Date   RETICCTPCT 1.6 02/07/2021   RBC 6.16 (H) 02/07/2021   RETICCTABS 103.2 04/28/2014   No results found  for: KPAFRELGTCHN, LAMBDASER, KAPLAMBRATIO No results found for: IGGSERUM, IGA, IGMSERUM No results found for: Austin Poole, SPEI   Chemistry      Component Value Date/Time   NA 138 02/07/2021 1007   NA 140 12/12/2016 1420   NA 137 08/17/2015 1305   K 3.9 02/07/2021 1007   K 3.9 12/12/2016 1420   K 4.4 08/17/2015 1305   CL 105 02/07/2021 1007   CL 106 12/12/2016 1420   CO2 25 02/07/2021 1007   CO2 25 12/12/2016 1420   CO2 21 (L) 08/17/2015 1305   BUN 15 02/07/2021 1007   BUN 16 12/12/2016 1420   BUN 16.2 08/17/2015 1305   CREATININE 1.05 02/07/2021 1007   CREATININE 1.4 (H) 12/12/2016 1420   CREATININE 1.0 08/17/2015 1305      Component Value Date/Time   CALCIUM 9.4 02/07/2021 1007   CALCIUM 9.5 12/12/2016 1420   CALCIUM 9.3 08/17/2015 1305   ALKPHOS 97 02/07/2021 1007   ALKPHOS 112 (H) 12/12/2016 1420   ALKPHOS 126 08/17/2015 1305   AST 19 02/07/2021 1007   AST 25 08/17/2015 1305   ALT 30 02/07/2021 1007   ALT 48 (H) 12/12/2016 1420   ALT 40 08/17/2015 1305   BILITOT 0.8 02/07/2021 1007   BILITOT 0.56 08/17/2015 1305       Impression and Plan: Austin Poole is a pleasant 35 yo [place] gentleman with secondary polycythemia due to smoking.  Phlebotomy done for Hct 49%.  Austin insurance changed on January 1st so he has to find a new PCP. He requested that we refill Austin Omeprazole. We will refill for one month to get him through until Austin new PCP visit.  Lab and phlebotomy in 8 weeks, follow-up in 4 months.   Lottie Dawson, NP 1/3/202310:54 AM

## 2021-02-07 NOTE — Progress Notes (Signed)
1 unit therapeutic phlebotomy performed over 8 minutes using a 16 gauge phlebotomy set. Patient tolerated well. Patient does not want to stay for the recommended 30 minute post phlebotomy observation. Vital signs stable. Patient discharged without complaints or concerns.

## 2021-02-07 NOTE — Patient Instructions (Signed)
Therapeutic Phlebotomy °Therapeutic phlebotomy is the planned removal of blood from a person's body for the purpose of treating a medical condition. The procedure is lot like donating blood. Usually, about a pint (470 mL, or 0.47 L) of blood is removed. The average adult has 9-12 pints (4.3-5.7 L) of blood in his or her body. °Therapeutic phlebotomy may be used to treat the following medical conditions: °Hemochromatosis. This is a condition in which the blood contains too much iron. °Polycythemia vera. This is a condition in which the blood contains too many red blood cells. °Porphyria cutanea tarda. This is a disease in which an important part of hemoglobin is not made properly. It results in the buildup of abnormal amounts of porphyrins in the body. °Sickle cell disease. This is a condition in which the red blood cells form an abnormal crescent shape rather than a round shape. °Tell a health care provider about: °Any allergies you have. °All medicines you are taking, including vitamins, herbs, eye drops, creams, and over-the-counter medicines. °Any bleeding problems you have. °Any surgeries you have had. °Any medical conditions you have. °Whether you are pregnant or may be pregnant. °What are the risks? °Generally, this is a safe procedure. However, problems may occur, including: °Nausea or light-headedness. °Low blood pressure (hypotension). °Soreness, bleeding, swelling, or bruising at the needle insertion site. °Infection. °What happens before the procedure? °Ask your health care provider about: °Changing or stopping your regular medicines. This is especially important if you are taking diabetes medicines or blood thinners. °Taking medicines such as aspirin and ibuprofen. These medicines can thin your blood. Do not take these medicines unless your health care provider tells you to take them. °Taking over-the-counter medicines, vitamins, herbs, and supplements. °Wear clothing with sleeves that can be raised  above the elbow. °You may have a blood sample taken. °Your blood pressure, pulse rate, and breathing rate will be measured. °What happens during the procedure? ° °You may be given a medicine to numb the area (local anesthetic). °A tourniquet will be placed on your arm. °A needle will be put into one of your veins. °Tubing and a collection bag will be attached to the needle. °Blood will flow through the needle and tubing into the collection bag. °The collection bag will be placed lower than your arm so gravity can help the blood flow into the bag. °You may be asked to open and close your hand slowly and continually during the entire collection. °After the specified amount of blood has been removed from your body, the collection bag and tubing will be clamped. °The needle will be removed from your vein. °Pressure will be held on the needle site to stop the bleeding. °A bandage (dressing) will be placed over the needle insertion site. °The procedure may vary among health care providers and hospitals. °What happens after the procedure? °Your blood pressure, pulse rate, and breathing rate will be measured after the procedure. °You will be encouraged to drink fluids. °You will be encouraged to eat a snack to prevent a low blood sugar level. °Your recovery will be assessed and monitored. °Return to your normal activities as told by your health care provider. °Summary °Therapeutic phlebotomy is the planned removal of blood from a person's body for the purpose of treating a medical condition. °Therapeutic phlebotomy may be used to treat hemochromatosis, polycythemia vera, porphyria cutanea tarda, or sickle cell disease. °In the procedure, a needle is inserted and about a pint (470 mL, or 0.47 L) of blood is   removed. The average adult has 9-12 pints (4.3-5.7 L) of blood in the body. °This is generally a safe procedure, but it can sometimes cause problems such as nausea, light-headedness, or low blood pressure  (hypotension). °This information is not intended to replace advice given to you by your health care provider. Make sure you discuss any questions you have with your health care provider. °Document Revised: 07/20/2020 Document Reviewed: 07/20/2020 °Elsevier Patient Education © 2022 [employer] ° °

## 2021-02-09 ENCOUNTER — Telehealth: Payer: Self-pay | Admitting: *Deleted

## 2021-02-09 NOTE — Telephone Encounter (Signed)
Per 02/07/21 los - called and gave upcoming appointments - confirmed

## 2021-03-28 ENCOUNTER — Ambulatory Visit: Payer: Self-pay | Admitting: Medical

## 2021-04-04 ENCOUNTER — Inpatient Hospital Stay: Payer: 59 | Attending: Family

## 2021-04-04 ENCOUNTER — Other Ambulatory Visit: Payer: Self-pay

## 2021-04-04 ENCOUNTER — Inpatient Hospital Stay: Payer: 59

## 2021-04-04 ENCOUNTER — Other Ambulatory Visit: Payer: Self-pay | Admitting: Family

## 2021-04-04 VITALS — BP 136/72 | HR 80 | Temp 98.5°F | Resp 18

## 2021-04-04 DIAGNOSIS — D751 Secondary polycythemia: Secondary | ICD-10-CM

## 2021-04-04 DIAGNOSIS — D500 Iron deficiency anemia secondary to blood loss (chronic): Secondary | ICD-10-CM

## 2021-04-04 LAB — CBC WITH DIFFERENTIAL (CANCER CENTER ONLY)
Abs Immature Granulocytes: 0.02 10*3/uL (ref 0.00–0.07)
Basophils Absolute: 0.1 10*3/uL (ref 0.0–0.1)
Basophils Relative: 1 %
Eosinophils Absolute: 0.4 10*3/uL (ref 0.0–0.5)
Eosinophils Relative: 6 %
HCT: 49.4 % (ref 39.0–52.0)
Hemoglobin: 16.2 g/dL (ref 13.0–17.0)
Immature Granulocytes: 0 %
Lymphocytes Relative: 34 %
Lymphs Abs: 2.6 10*3/uL (ref 0.7–4.0)
MCH: 25.7 pg — ABNORMAL LOW (ref 26.0–34.0)
MCHC: 32.8 g/dL (ref 30.0–36.0)
MCV: 78.3 fL — ABNORMAL LOW (ref 80.0–100.0)
Monocytes Absolute: 0.7 10*3/uL (ref 0.1–1.0)
Monocytes Relative: 9 %
Neutro Abs: 3.9 10*3/uL (ref 1.7–7.7)
Neutrophils Relative %: 50 %
Platelet Count: 255 10*3/uL (ref 150–400)
RBC: 6.31 MIL/uL — ABNORMAL HIGH (ref 4.22–5.81)
RDW: 16.4 % — ABNORMAL HIGH (ref 11.5–15.5)
WBC Count: 7.7 10*3/uL (ref 4.0–10.5)
nRBC: 0 % (ref 0.0–0.2)

## 2021-04-04 LAB — CMP (CANCER CENTER ONLY)
ALT: 29 U/L (ref 0–44)
AST: 23 U/L (ref 15–41)
Albumin: 3.9 g/dL (ref 3.5–5.0)
Alkaline Phosphatase: 98 U/L (ref 38–126)
Anion gap: 6 (ref 5–15)
BUN: 18 mg/dL (ref 6–20)
CO2: 27 mmol/L (ref 22–32)
Calcium: 9.1 mg/dL (ref 8.9–10.3)
Chloride: 106 mmol/L (ref 98–111)
Creatinine: 1.1 mg/dL (ref 0.61–1.24)
GFR, Estimated: >60 mL/min (ref >60)
Glucose, Bld: 111 mg/dL — ABNORMAL HIGH (ref 70–99)
Potassium: 4.2 mmol/L (ref 3.5–5.1)
Sodium: 139 mmol/L (ref 135–145)
Total Bilirubin: 0.6 mg/dL (ref 0.3–1.2)
Total Protein: 6.6 g/dL (ref 6.5–8.1)

## 2021-04-04 LAB — IRON AND IRON BINDING CAPACITY (CC-WL,HP ONLY)
Iron: 43 ug/dL — ABNORMAL LOW (ref 45–182)
Saturation Ratios: 10 % — ABNORMAL LOW (ref 17.9–39.5)
TIBC: 448 ug/dL (ref 250–450)
UIBC: 405 ug/dL — ABNORMAL HIGH (ref 117–376)

## 2021-04-04 LAB — FERRITIN: Ferritin: 11 ng/mL — ABNORMAL LOW (ref 24–336)

## 2021-04-04 NOTE — Patient Instructions (Signed)

## 2021-04-04 NOTE — Progress Notes (Signed)
Austin Poole presents today for phlebotomy per MD orders. Phlebotomy procedure started at 1030 and ended at 1035. 505 cc removed via 16 G needle at [facility] site. Patient tolerated procedure well. Patient refused to wait 30 minutes post phlebotomy. Released stable and ASX.

## 2021-04-19 ENCOUNTER — Encounter: Payer: Self-pay | Admitting: Medical

## 2021-04-19 ENCOUNTER — Ambulatory Visit (INDEPENDENT_AMBULATORY_CARE_PROVIDER_SITE_OTHER): Payer: 59 | Admitting: Medical

## 2021-04-19 VITALS — BP 120/70 | HR 80 | Temp 98.0°F | Resp 18 | Ht 70.0 in | Wt 239.0 lb

## 2021-04-19 DIAGNOSIS — I10 Essential (primary) hypertension: Secondary | ICD-10-CM

## 2021-04-19 DIAGNOSIS — D751 Secondary polycythemia: Secondary | ICD-10-CM | POA: Diagnosis not present

## 2021-04-19 DIAGNOSIS — J301 Allergic rhinitis due to pollen: Secondary | ICD-10-CM | POA: Diagnosis not present

## 2021-04-19 DIAGNOSIS — K219 Gastro-esophageal reflux disease without esophagitis: Secondary | ICD-10-CM

## 2021-04-19 DIAGNOSIS — F17200 Nicotine dependence, unspecified, uncomplicated: Secondary | ICD-10-CM

## 2021-04-19 MED ORDER — OMEPRAZOLE 20 MG PO CPDR: 20.0000 mg | DELAYED_RELEASE_CAPSULE | Freq: Every day | ORAL | 3 refills | Status: AC

## 2021-04-19 MED ORDER — FLUTICASONE PROPIONATE 50 MCG/ACT NA SUSP: 2.0000 | Freq: Every day | NASAL | 1 refills | Status: AC

## 2021-04-19 MED ORDER — CETIRIZINE HCL 10 MG PO TABS: 10.0000 mg | ORAL_TABLET | Freq: Every day | ORAL | 11 refills | Status: AC

## 2021-04-19 NOTE — Patient Instructions (Addendum)
Htn- bp is good today when MA and myself checked. Presently would not put you back on med presently. Recommend checking bp daily and bring readings in for review. Caution on red bull use. ? ?For gerd. Recommend healthy diet and use omeprazole if needed. ? ?For allergies refilled your zyrtec and flonase.  ? ?For smoking cessation completely can consider med called wellbutrin. Let me know if you want to try ? ?Follow up in 2-3 weeks for wellness exam/fasting in morning.  ?

## 2021-04-19 NOTE — Progress Notes (Signed)
? ?Subjective:  ? ? Patient ID: Austin Poole, male    DOB: 28-May-1985, 36 y.o.   MRN: [id number] ? ?HPI ? ?Pt in for first time.  ? ? 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 Admits not perfect diet. Eats out moderately. Drinks one red bull a day. 10-11 year pack a day smoker total. Though much less now. No alcohol Married- 3 children. 35 yo, 60 yo and 46 yo. ? ? ?Htn in past. Pt used to be on zestoretic in past. No med for 1.5 hours. Red bull earlier today. ? ?Jerrye Bushy- has been on omeprazole. Worse when eats out a lot.Better over past 1.5 months. Has not used. ? ?Seasonal allergies- flonase and zyrtec when needed.  ? ?Polycythemia secondary to smoking. ? ? ? ?Review of Systems  ?Constitutional:  Negative for chills, fatigue and fever.  ?Respiratory:  Negative for cough, chest tightness, shortness of breath and wheezing.   ?Cardiovascular:  Negative for chest pain and palpitations.  ?Gastrointestinal:  Negative for abdominal pain and anal bleeding.  ?Genitourinary:  Negative for dysuria and frequency.  ?Musculoskeletal:  Negative for back pain, joint swelling and myalgias.  ?Neurological:  Negative for dizziness, light-headedness, numbness and headaches.  ?Hematological:  Negative for adenopathy. Does not bruise/bleed easily.  ?Psychiatric/Behavioral:  Negative for behavioral problems and confusion.   ? ?Past Medical History:  ?Diagnosis Date  ? Lipoma 04/25/2011  ? Polycythemia secondary to smoking 03/09/2015  ? ?  ?Social History  ? ?Socioeconomic History  ? Marital status: Married  ?  Spouse name: Not on file  ? Number of children: Not on file  ? Years of education: Not on file  ? Highest education level: Not on file  ?Occupational History  ? Not on file  ?Tobacco Use  ? Smoking status: Every Day  ?  Packs/day: 0.50  ?  Years: 7.00  ?  Pack years: 3.50  ?  Types: Cigarettes  ?  Start date: 04/28/2006  ? Smokeless tobacco: Never  ?Vaping Use  ? Vaping Use: Never used  ?Substance and  Sexual Activity  ? Alcohol use: No  ?  Alcohol/week: 0.0 standard drinks  ? Drug use: No  ? Sexual activity: Not on file  ?Other Topics Concern  ? Not on file  ?Social History Narrative  ? Not on file  ? ?Social Determinants of Health  ? ?Financial Resource Strain: Not on file  ?Food Insecurity: Not on file  ?Transportation Needs: Not on file  ?Physical Activity: Not on file  ?Stress: Not on file  ?Social Connections: Not on file  ?Intimate Partner Violence: Not on file  ? ? ?No past surgical history on file. ? ?Family History  ?Problem Relation Age of Onset  ? CAD Mother 40  ?     CAD/Stent  ? Diabetes Mother   ? CAD Father 3  ?     MI  ? ? ?No Known Allergies ? ?Current Outpatient Medications on File Prior to Visit  ?Medication Sig Dispense Refill  ? aspirin 81 MG chewable tablet Chew by mouth.    ? GAVILAX 17 GM/SCOOP powder TAKE 17 GRAMS MIXED WITH 8OZ WATER, JUICE, SODA, COFFEE OR TEA BY MOUTH ONE TIME DAILY FOR 30 DAYS    ? lisinopril-hydrochlorothiazide (ZESTORETIC) 20-12.5 MG tablet Take 1 tablet by mouth daily.    ? omeprazole (PRILOSEC) 20 MG capsule Take 1 capsule (20 mg total) by mouth daily. 30 capsule 0  ? ?  No current facility-administered medications on file prior to visit.  ? ? ?BP 120/66   Pulse 80   Temp 98 ?F (36.7 ?C)   Resp 18   Ht '5\' 10"'$  (1.778 m)   Wt 239 lb (108.4 kg)   SpO2 99%   BMI 34.29 kg/m?  ?  ?   ?Objective:  ? Physical Exam ? ?General ?Mental Status- Alert. General Appearance- Not in acute distress.  ? ?Skin ?General: Color- Normal Color. Moisture- Normal Moisture. ? ?Neck ?Carotid Arteries- Normal color. Moisture- Normal Moisture. No carotid bruits. No JVD. ? ?Chest and Lung Exam ?Auscultation: ?Breath Sounds:-Normal. ? ?Cardiovascular ?Auscultation:Rythm- Regular. ?Murmurs & Other Heart Sounds:Auscultation of the heart reveals- No Murmurs. ? ?Abdomen ?Inspection:-Inspeection Normal. ?Palpation/Percussion:Note:No mass. Palpation and Percussion of the abdomen reveal- Non  Tender, Non Distended + BS, no rebound or guarding. ? ? ?Neurologic ?Cranial Nerve exam:- CN III-XII intact(No nystagmus), symmetric smile. ?Strength:- 5/5 equal and symmetric strength both upper and lower extremities.  ? ? ?   ?Assessment & Plan:  ? ?Patient Instructions  ?Htn- bp is good today when MA and myself checked. Presently would not put you back on med presently. Recommend checking bp daily and bring readings in for review. Caution on red bull use. ? ?For gerd. Recommend healthy diet and use omeprazole if needed. ? ?For allergies refilled your zyrtec and flonase.  ? ?For smoking cessation completely can consider med callled wellbutrin. Let me know if you want to try ? ?Follow up in 2-3 weeks for wellness exam/fasting in morning.   ?

## 2021-05-09 ENCOUNTER — Ambulatory Visit (INDEPENDENT_AMBULATORY_CARE_PROVIDER_SITE_OTHER): Payer: 59 | Admitting: Medical

## 2021-05-09 ENCOUNTER — Encounter: Payer: Self-pay | Admitting: Medical

## 2021-05-09 VITALS — BP 132/80 | HR 62 | Temp 98.5°F | Resp 18 | Ht 70.0 in | Wt 235.0 lb

## 2021-05-09 DIAGNOSIS — Z0000 Encounter for general adult medical examination without abnormal findings: Secondary | ICD-10-CM | POA: Diagnosis not present

## 2021-05-09 NOTE — Patient Instructions (Addendum)
For you wellness exam today I have ordered cbc, cmp and lipid panel. ? ?Vaccine today tdap. ? ?Recommend exercise and healthy diet. ? ?We will let you know lab results as they come in. ? ?Follow up date appointment will be determined after lab review.   ? ?Recommend stop smoking final 1-2 cigarettes a day. ? ?Htn- historical med lisinopril/hctz. Overall bp good and you are not on med for 3 months. Recommend check bp daily for one week and notify of readings. You may need to be back on. ? ? ? ?Preventive Care 25-15 Years Old, Male ?Preventive care refers to lifestyle choices and visits with your health care provider that can promote health and wellness. Preventive care visits are also called wellness exams. ?What can I expect for my preventive care visit? ?Counseling ?During your preventive care visit, your health care provider may ask about your: ?Medical history, including: ?Past medical problems. ?Family medical history. ?Current health, including: ?Emotional well-being. ?Home life and relationship well-being. ?Sexual activity. ?Lifestyle, including: ?Alcohol, nicotine or tobacco, and drug use. ?Access to firearms. ?Diet, exercise, and sleep habits. ?Safety issues such as seatbelt and bike helmet use. ?Sunscreen use. ?Work and work Statistician. ?Physical exam ?Your health care provider may check your: ?Height and weight. These may be used to calculate your BMI (body mass index). BMI is a measurement that tells if you are at a healthy weight. ?Waist circumference. This measures the distance around your waistline. This measurement also tells if you are at a healthy weight and may help predict your risk of certain diseases, such as type 2 diabetes and high blood pressure. ?Heart rate and blood pressure. ?Body temperature. ?Skin for abnormal spots. ?What immunizations do I need? ?Vaccines are usually given at various ages, according to a schedule. Your health care provider will recommend vaccines for you based on your  age, medical history, and lifestyle or other factors, such as travel or where you work. ?What tests do I need? ?Screening ?Your health care provider may recommend screening tests for certain conditions. This may include: ?Lipid and cholesterol levels. ?Diabetes screening. This is done by checking your blood sugar (glucose) after you have not eaten for a while (fasting). ?Hepatitis B test. ?Hepatitis C test. ?HIV (human immunodeficiency virus) test. ?STI (sexually transmitted infection) testing, if you are at risk. ?Talk with your health care provider about your test results, treatment options, and if necessary, the need for more tests. ?Follow these instructions at home: ?Eating and drinking ? ?Eat a healthy diet that includes fresh fruits and vegetables, whole grains, lean protein, and low-fat dairy products. ?Drink enough fluid to keep your urine pale yellow. ?Take vitamin and mineral supplements as recommended by your health care provider. ?Do not drink alcohol if your health care provider tells you not to drink. ?If you drink alcohol: ?Limit how much you have to 0-2 drinks a day. ?Know how much alcohol is in your drink. In the [place] one drink equals one 12 oz bottle of beer (355 mL), one 5 oz glass of wine (148 mL), or one 1? oz glass of hard liquor (44 mL). ?Lifestyle ?Brush your teeth every morning and night with fluoride toothpaste. Floss one time each day. ?Exercise for at least 30 minutes 5 or more days each week. ?Do not use any products that contain nicotine or tobacco. These products include cigarettes, chewing tobacco, and vaping devices, such as e-cigarettes. If you need help quitting, ask your health care provider. ?Do not use drugs. ?If you  are sexually active, practice safe sex. Use a condom or other form of protection to prevent STIs. ?Find healthy ways to manage stress, such as: ?Meditation, yoga, or listening to music. ?Journaling. ?Talking to a trusted person. ?Spending time with friends and  family. ?Minimize exposure to UV radiation to reduce your risk of skin cancer. ?Safety ?Always wear your seat belt while driving or riding in a vehicle. ?Do not drive: ?If you have been drinking alcohol. Do not ride with someone who has been drinking. ?If you have been using any mind-altering substances or drugs. ?While texting. ?When you are tired or distracted. ?Wear a helmet and other protective equipment during sports activities. ?If you have firearms in your house, make sure you follow all gun safety procedures. ?Seek help if you have been physically or sexually abused. ?What's next? ?Go to your health care provider once a year for an annual wellness visit. ?Ask your health care provider how often you should have your eyes and teeth checked. ?Stay up to date on all vaccines. ?This information is not intended to replace advice given to you by your health care provider. Make sure you discuss any questions you have with your health care provider. ?Document Revised: 07/20/2020 Document Reviewed: 07/20/2020 ?Elsevier Patient Education ? 2022 [place]. ? ? ? ?

## 2021-05-09 NOTE — Progress Notes (Signed)
? ?Subjective:  ? ? Patient ID: Austin Poole, male    DOB: [birth year], 36 y.o.   MRN: [id number] ? ?HPI ? ? ?Pt in for wellness exam. Pt is fasting. ? 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 Admits not perfect diet. Eats out moderately. Drinks one red bull a day. 10-11 year pack a day smoker total. Though much less now. No alcohol Married- 3 children. 58 yo, 70 yo and 50 yo. ? ?Pt will get tdap today. ? ?Pt had 3 covid vaccines in past. ? ?Smokes 1-2 cigarette a day. ? ? ?Review of Systems  ?Constitutional:  Negative for chills, fatigue and fever.  ?Respiratory:  Negative for cough, chest tightness, shortness of breath and wheezing.   ?Cardiovascular:  Negative for chest pain and palpitations.  ?Gastrointestinal:  Negative for abdominal pain and anal bleeding.  ?Genitourinary:  Negative for dysuria and frequency.  ?Musculoskeletal:  Negative for back pain, joint swelling and myalgias.  ?Neurological:  Negative for dizziness, light-headedness, numbness and headaches.  ?Hematological:  Negative for adenopathy. Does not bruise/bleed easily.  ?Psychiatric/Behavioral:  Negative for behavioral problems and confusion.   ? ? ?Past Medical History:  ?Diagnosis Date  ? Allergy   ? GERD (gastroesophageal reflux disease)   ? Hypertension   ? Lipoma 04/25/2011  ? Polycythemia secondary to smoking 03/09/2015  ? ?  ?Social History  ? ?Socioeconomic History  ? Marital status: Married  ?  Spouse name: Not on file  ? Number of children: Not on file  ? Years of education: Not on file  ? Highest education level: Not on file  ?Occupational History  ? Not on file  ?Tobacco Use  ? Smoking status: Every Day  ?  Packs/day: 1.00  ?  Years: 11.00  ?  Pack years: 11.00  ?  Types: Cigarettes  ?  Start date: 04/28/2006  ? Smokeless tobacco: Never  ?Vaping Use  ? Vaping Use: Never used  ?Substance and Sexual Activity  ? Alcohol use: No  ?  Alcohol/week: 0.0 standard drinks  ? Drug use: No  ? Sexual activity: Yes   ?Other Topics Concern  ? Not on file  ?Social History Narrative  ? Not on file  ? ?Social Determinants of Health  ? ?Financial Resource Strain: Not on file  ?Food Insecurity: Not on file  ?Transportation Needs: Not on file  ?Physical Activity: Not on file  ?Stress: Not on file  ?Social Connections: Not on file  ?Intimate Partner Violence: Not on file  ? ? ?Past Surgical History:  ?Procedure Laterality Date  ? NASAL SEPTUM SURGERY    ? ? ?Family History  ?Problem Relation Age of Onset  ? CAD Mother 78  ?     CAD/Stent  ? Diabetes Mother   ? CAD Father 43  ?     MI  ? ? ?No Known Allergies ? ?Current Outpatient Medications on File Prior to Visit  ?Medication Sig Dispense Refill  ? aspirin 81 MG chewable tablet Chew by mouth.    ? cetirizine (ZYRTEC) 10 MG tablet Take 1 tablet (10 mg total) by mouth daily. 30 tablet 11  ? fluticasone (FLONASE) 50 MCG/ACT nasal spray Place 2 sprays into both nostrils daily. 16 g 1  ? GAVILAX 17 GM/SCOOP powder TAKE 17 GRAMS MIXED WITH 8OZ WATER, JUICE, SODA, COFFEE OR TEA BY MOUTH ONE TIME DAILY FOR 30 DAYS    ? lisinopril-hydrochlorothiazide (ZESTORETIC) 20-12.5 MG tablet Take 1  tablet by mouth daily.    ? omeprazole (PRILOSEC) 20 MG capsule Take 1 capsule (20 mg total) by mouth daily. 30 capsule 0  ? omeprazole (PRILOSEC) 20 MG capsule Take 1 capsule (20 mg total) by mouth daily. 30 capsule 3  ? ?No current facility-administered medications on file prior to visit.  ? ? ?BP 129/80   Pulse 62   Temp 98.5 ?F (36.9 ?C)   Resp 18   Ht '5\' 10"'$  (1.778 m)   Wt 235 lb (106.6 kg)   SpO2 99%   BMI 33.72 kg/m?  ?  ?   ?Objective:  ? Physical Exam ? ?General ?Mental Status- Alert. General Appearance- Not in acute distress.  ? ?Skin ?General: Color- Normal Color. Moisture- Normal Moisture. ? ?Neck ?Carotid Arteries- Normal color. Moisture- Normal Moisture. No carotid bruits. No JVD. ? ?Chest and Lung Exam ?Auscultation: ?Breath Sounds:-Normal. ? ?Cardiovascular ?Auscultation:Rythm-  Regular. ?Murmurs & Other Heart Sounds:Auscultation of the heart reveals- No Murmurs. ? ?Abdomen ?Inspection:-Inspeection Normal. ?Palpation/Percussion:Note:No mass. Palpation and Percussion of the abdomen reveal- Non Tender, Non Distended + BS, no rebound or guarding. ? ? ?Neurologic ?Cranial Nerve exam:- CN III-XII intact(No nystagmus), symmetric smile. ?Strength:- 5/5 equal and symmetric strength both upper and lower extremities.  ? ? ?   ?Assessment & Plan:  ?For you wellness exam today I have ordered cbc, cmp and lipid panel. ? ?Vaccine today tdap. ? ?Recommend exercise and healthy diet. ? ?We will let you know lab results as they come in. ? ?Follow up date appointment will be determined after lab review.   ? ?Recommend stop smoking final 1-2 cigarettes a day. ? ?Htn- historical med lisinopril/hctz. Overall bp good and you are not on med for 3 months. Recommend check bp daily for one week and notify of readings. You may need to be back on. ? ?Mackie Pai, PA-C  ? ?

## 2021-05-10 LAB — COMPREHENSIVE METABOLIC PANEL
ALT: 36 U/L (ref 0–53)
AST: 24 U/L (ref 0–37)
Albumin: 4.4 g/dL (ref 3.5–5.2)
Alkaline Phosphatase: 111 U/L (ref 39–117)
BUN: 15 mg/dL (ref 6–23)
CO2: 25 mEq/L (ref 19–32)
Calcium: 9.8 mg/dL (ref 8.4–10.5)
Chloride: 107 mEq/L (ref 96–112)
Creatinine, Ser: 1.07 mg/dL (ref 0.40–1.50)
GFR: 88.64 mL/min (ref >60.00)
Glucose, Bld: 86 mg/dL (ref 70–99)
Potassium: 4.7 mEq/L (ref 3.5–5.1)
Sodium: 139 mEq/L (ref 135–145)
Total Bilirubin: 0.6 mg/dL (ref 0.2–1.2)
Total Protein: 6.5 g/dL (ref 6.0–8.3)

## 2021-05-10 LAB — CBC WITH DIFFERENTIAL/PLATELET
Basophils Absolute: 0.1 10*3/uL (ref 0.0–0.1)
Basophils Relative: 1 % (ref 0.0–3.0)
Eosinophils Absolute: 0.3 10*3/uL (ref 0.0–0.7)
Eosinophils Relative: 5.4 % — ABNORMAL HIGH (ref 0.0–5.0)
HCT: 47.5 % (ref 39.0–52.0)
Hemoglobin: 15.4 g/dL (ref 13.0–17.0)
Lymphocytes Relative: 36.8 % (ref 12.0–46.0)
Lymphs Abs: 2.3 10*3/uL (ref 0.7–4.0)
MCHC: 32.4 g/dL (ref 30.0–36.0)
MCV: 78.9 fl (ref 78.0–100.0)
Monocytes Absolute: 0.7 10*3/uL (ref 0.1–1.0)
Monocytes Relative: 11.3 % (ref 3.0–12.0)
Neutro Abs: 2.8 10*3/uL (ref 1.4–7.7)
Neutrophils Relative %: 45.5 % (ref 43.0–77.0)
Platelets: 238 10*3/uL (ref 150.0–400.0)
RBC: 6.02 Mil/uL — ABNORMAL HIGH (ref 4.22–5.81)
RDW: 15.9 % — ABNORMAL HIGH (ref 11.5–15.5)
WBC: 6.3 10*3/uL (ref 4.0–10.5)

## 2021-05-10 LAB — LIPID PANEL
Cholesterol: 184 mg/dL (ref 0–200)
HDL: 45.4 mg/dL (ref >39.00)
LDL Cholesterol: 123 mg/dL — ABNORMAL HIGH (ref 0–99)
NonHDL: 138.73
Total CHOL/HDL Ratio: 4
Triglycerides: 77 mg/dL (ref 0.0–149.0)
VLDL: 15.4 mg/dL (ref 0.0–40.0)

## 2021-05-30 ENCOUNTER — Telehealth: Payer: Self-pay | Admitting: *Deleted

## 2021-05-30 NOTE — Telephone Encounter (Signed)
Called to reschedule appointment with Austin Poole - confirmed with patient's wife ?

## 2021-06-07 ENCOUNTER — Other Ambulatory Visit: Payer: 59

## 2021-06-07 ENCOUNTER — Ambulatory Visit: Payer: 59 | Admitting: Family

## 2021-06-13 ENCOUNTER — Inpatient Hospital Stay: Payer: 59

## 2021-06-13 ENCOUNTER — Inpatient Hospital Stay: Payer: 59 | Admitting: Family

## 2021-06-27 ENCOUNTER — Inpatient Hospital Stay: Payer: 59

## 2021-06-27 ENCOUNTER — Encounter: Payer: Self-pay | Admitting: Family

## 2021-06-27 ENCOUNTER — Inpatient Hospital Stay: Payer: 59 | Attending: Family | Admitting: Family

## 2021-06-27 VITALS — BP 138/86 | HR 84 | Temp 98.6°F | Resp 18 | Wt 235.1 lb

## 2021-06-27 DIAGNOSIS — K219 Gastro-esophageal reflux disease without esophagitis: Secondary | ICD-10-CM | POA: Insufficient documentation

## 2021-06-27 DIAGNOSIS — Z7982 Long term (current) use of aspirin: Secondary | ICD-10-CM | POA: Diagnosis not present

## 2021-06-27 DIAGNOSIS — F17210 Nicotine dependence, cigarettes, uncomplicated: Secondary | ICD-10-CM | POA: Insufficient documentation

## 2021-06-27 DIAGNOSIS — D751 Secondary polycythemia: Secondary | ICD-10-CM | POA: Diagnosis not present

## 2021-06-27 DIAGNOSIS — D500 Iron deficiency anemia secondary to blood loss (chronic): Secondary | ICD-10-CM

## 2021-06-27 LAB — RETICULOCYTES
Immature Retic Fract: 12.2 % (ref 2.3–15.9)
RBC.: 6.23 MIL/uL — ABNORMAL HIGH (ref 4.22–5.81)
Retic Count, Absolute: 92.2 10*3/uL (ref 19.0–186.0)
Retic Ct Pct: 1.5 % (ref 0.4–3.1)

## 2021-06-27 LAB — CBC WITH DIFFERENTIAL (CANCER CENTER ONLY)
Abs Immature Granulocytes: 0.1 10*3/uL — ABNORMAL HIGH (ref 0.00–0.07)
Basophils Absolute: 0 10*3/uL (ref 0.0–0.1)
Basophils Relative: 1 %
Eosinophils Absolute: 0.3 10*3/uL (ref 0.0–0.5)
Eosinophils Relative: 5 %
HCT: 48.5 % (ref 39.0–52.0)
Hemoglobin: 16 g/dL (ref 13.0–17.0)
Immature Granulocytes: 2 %
Lymphocytes Relative: 35 %
Lymphs Abs: 2 10*3/uL (ref 0.7–4.0)
MCH: 25.7 pg — ABNORMAL LOW (ref 26.0–34.0)
MCHC: 33 g/dL (ref 30.0–36.0)
MCV: 77.8 fL — ABNORMAL LOW (ref 80.0–100.0)
Monocytes Absolute: 0.3 10*3/uL (ref 0.1–1.0)
Monocytes Relative: 5 %
Neutro Abs: 3.1 10*3/uL (ref 1.7–7.7)
Neutrophils Relative %: 52 %
Platelet Count: 241 10*3/uL (ref 150–400)
RBC: 6.23 MIL/uL — ABNORMAL HIGH (ref 4.22–5.81)
RDW: 17.5 % — ABNORMAL HIGH (ref 11.5–15.5)
WBC Count: 5.8 10*3/uL (ref 4.0–10.5)
nRBC: 0 % (ref 0.0–0.2)

## 2021-06-27 LAB — CMP (CANCER CENTER ONLY)
ALT: 28 U/L (ref 0–44)
AST: 22 U/L (ref 15–41)
Albumin: 4.1 g/dL (ref 3.5–5.0)
Alkaline Phosphatase: 98 U/L (ref 38–126)
Anion gap: 7 (ref 5–15)
BUN: 16 mg/dL (ref 6–20)
CO2: 27 mmol/L (ref 22–32)
Calcium: 9.3 mg/dL (ref 8.9–10.3)
Chloride: 105 mmol/L (ref 98–111)
Creatinine: 1.08 mg/dL (ref 0.61–1.24)
GFR, Estimated: >60 mL/min (ref >60)
Glucose, Bld: 142 mg/dL — ABNORMAL HIGH (ref 70–99)
Potassium: 4 mmol/L (ref 3.5–5.1)
Sodium: 139 mmol/L (ref 135–145)
Total Bilirubin: 0.7 mg/dL (ref 0.3–1.2)
Total Protein: 6.7 g/dL (ref 6.5–8.1)

## 2021-06-27 LAB — FERRITIN: Ferritin: 10 ng/mL — ABNORMAL LOW (ref 24–336)

## 2021-06-27 NOTE — Progress Notes (Signed)
Hematology and Oncology Follow Up Visit  Austin Poole [id number] Mar 13, 1985 36 y.o. 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 He is doing well and has no complaints at this time.  He is smoking less and going to the gym regularly. No blood loss noted. No bruising or petechia.  No fever, chills, n/v, cough, rash, dizziness, SOB, chest pain, palpitations, abdominal pain or changes in bowel or bladder habits.  No swelling, tenderness, numbness or tingling in his extremities.  No falls or syncope reported.  Appetite and hydration are good. Weight stable at 235 lbs.   ECOG Performance Status: 1 - Symptomatic but completely ambulatory  Medications:  Allergies as of 06/27/2021   No Known Allergies      Medication List        Accurate as of Jun 27, 2021  1:49 PM. If you have any questions, ask your nurse or doctor.          aspirin 81 MG chewable tablet Chew by mouth.   cetirizine 10 MG tablet Commonly known as: ZYRTEC Take 1 tablet (10 mg total) by mouth daily.   fluticasone 50 MCG/ACT nasal spray Commonly known as: FLONASE Place 2 sprays into both nostrils daily.   GaviLAX 17 GM/SCOOP powder Generic drug: polyethylene glycol powder TAKE 17 GRAMS MIXED WITH 8OZ WATER, JUICE, SODA, COFFEE OR TEA BY MOUTH ONE TIME DAILY FOR 30 DAYS   lisinopril-hydrochlorothiazide 20-12.5 MG tablet Commonly known as: ZESTORETIC Take 1 tablet by mouth daily.   omeprazole 20 MG capsule Commonly known as: PRILOSEC Take 1 capsule (20 mg total) by mouth daily.   omeprazole 20 MG capsule Commonly known as: PRILOSEC Take 1 capsule (20 mg total) by mouth daily.        Allergies: No Known  Allergies  Past Medical History, Surgical history, Social history, and Family History were reviewed and updated.  Review of Systems: All other 10 point review of systems is negative.   Physical Exam:  vitals were not taken for this visit.   Wt Readings from Last 3 Encounters:  05/09/21 235 lb (106.6 kg)  04/19/21 239 lb (108.4 kg)  02/07/21 238 lb (108 kg)    Ocular: Sclerae unicteric, pupils equal, round and reactive to light Ear-nose-throat: Oropharynx clear, dentition fair Lymphatic: No cervical or supraclavicular adenopathy Lungs no rales or rhonchi, good excursion bilaterally Heart regular rate and rhythm, no murmur appreciated Abd soft, nontender, positive bowel sounds MSK no focal spinal tenderness, no joint edema Neuro: non-focal, well-oriented, appropriate affect Breasts: Deferred   Lab Results  Component Value Date   WBC 5.8 06/27/2021   HGB 16.0 06/27/2021   HCT 48.5 06/27/2021   MCV 77.8 (L) 06/27/2021   PLT 241 06/27/2021   Lab Results  Component Value Date   FERRITIN 11 (L) 04/04/2021   IRON 43 (L) 04/04/2021   TIBC 448 04/04/2021   UIBC 405 (H) 04/04/2021   IRONPCTSAT 10 (L) 04/04/2021   Lab Results  Component Value Date   RETICCTPCT 1.5 06/27/2021   RBC 6.23 (H) 06/27/2021   RBC 6.23 (H) 06/27/2021   RETICCTABS 103.2 04/28/2014   No results found for: KPAFRELGTCHN, LAMBDASER,  KAPLAMBRATIO No results found for: Kandis Cocking, IGMSERUM No results found for: Odetta Pink, SPEI   Chemistry      Component Value Date/Time   NA 139 05/09/2021 1331   NA 140 12/12/2016 1420   NA 137 08/17/2015 1305   K 4.7 05/09/2021 1331   K 3.9 12/12/2016 1420   K 4.4 08/17/2015 1305   CL 107 05/09/2021 1331   CL 106 12/12/2016 1420   CO2 25 05/09/2021 1331   CO2 25 12/12/2016 1420   CO2 21 (L) 08/17/2015 1305   BUN 15 05/09/2021 1331   BUN 16 12/12/2016 1420   BUN 16.2 08/17/2015 1305   CREATININE 1.07  05/09/2021 1331   CREATININE 1.10 04/04/2021 1005   CREATININE 1.4 (H) 12/12/2016 1420   CREATININE 1.0 08/17/2015 1305      Component Value Date/Time   CALCIUM 9.8 05/09/2021 1331   CALCIUM 9.5 12/12/2016 1420   CALCIUM 9.3 08/17/2015 1305   ALKPHOS 111 05/09/2021 1331   ALKPHOS 112 (H) 12/12/2016 1420   ALKPHOS 126 08/17/2015 1305   AST 24 05/09/2021 1331   AST 23 04/04/2021 1005   AST 25 08/17/2015 1305   ALT 36 05/09/2021 1331   ALT 29 04/04/2021 1005   ALT 48 (H) 12/12/2016 1420   ALT 40 08/17/2015 1305   BILITOT 0.6 05/09/2021 1331   BILITOT 0.6 04/04/2021 1005   BILITOT 0.56 08/17/2015 1305       Impression and Plan: Austin Poole is a pleasant 36 yo [place] gentleman with secondary polycythemia due to smoking.  Phlebotomy today, Hct 48.5%.  Lab check and phlebotomy in 2 months, follow-up in 4 months.   Lottie Dawson, NP 5/23/20231:49 PM

## 2021-06-27 NOTE — Progress Notes (Signed)
Austin Poole presents today for phlebotomy per MD orders. Phlebotomy procedure started at 1410 at [facility] site using a 16 G needle,by Armanda Magic, RN. Blood flow stopped at 1415, after 157 ml collected. A second attempt was made by this nurse at [facility] site, with a 16 G needle, at 1417.   Another 404 ml collected and phlebotomy ended at 1422.  561 cc removed total. Patient tolerated procedure well. IV needle removed intact.

## 2021-06-27 NOTE — Patient Instructions (Signed)

## 2021-06-28 LAB — IRON AND IRON BINDING CAPACITY (CC-WL,HP ONLY)
Iron: 65 ug/dL (ref 45–182)
Saturation Ratios: 16 % — ABNORMAL LOW (ref 17.9–39.5)
TIBC: 402 ug/dL (ref 250–450)
UIBC: 337 ug/dL (ref 117–376)

## 2021-07-14 ENCOUNTER — Encounter: Payer: Self-pay | Admitting: Family

## 2021-08-15 ENCOUNTER — Encounter: Payer: Self-pay | Admitting: Family

## 2021-08-28 ENCOUNTER — Other Ambulatory Visit: Payer: 59

## 2021-08-29 ENCOUNTER — Inpatient Hospital Stay: Payer: 59

## 2021-08-29 ENCOUNTER — Inpatient Hospital Stay: Payer: 59 | Attending: Family

## 2021-08-29 VITALS — BP 125/78 | HR 70 | Temp 97.8°F | Resp 20

## 2021-08-29 DIAGNOSIS — D751 Secondary polycythemia: Secondary | ICD-10-CM

## 2021-08-29 DIAGNOSIS — D500 Iron deficiency anemia secondary to blood loss (chronic): Secondary | ICD-10-CM

## 2021-08-29 LAB — CBC WITH DIFFERENTIAL (CANCER CENTER ONLY)
Abs Immature Granulocytes: 0.03 10*3/uL (ref 0.00–0.07)
Basophils Absolute: 0.1 10*3/uL (ref 0.0–0.1)
Basophils Relative: 1 %
Eosinophils Absolute: 0.4 10*3/uL (ref 0.0–0.5)
Eosinophils Relative: 6 %
HCT: 49.9 % (ref 39.0–52.0)
Hemoglobin: 16.2 g/dL (ref 13.0–17.0)
Immature Granulocytes: 0 %
Lymphocytes Relative: 35 %
Lymphs Abs: 2.5 10*3/uL (ref 0.7–4.0)
MCH: 25.7 pg — ABNORMAL LOW (ref 26.0–34.0)
MCHC: 32.5 g/dL (ref 30.0–36.0)
MCV: 79.2 fL — ABNORMAL LOW (ref 80.0–100.0)
Monocytes Absolute: 0.6 10*3/uL (ref 0.1–1.0)
Monocytes Relative: 9 %
Neutro Abs: 3.5 10*3/uL (ref 1.7–7.7)
Neutrophils Relative %: 49 %
Platelet Count: 227 10*3/uL (ref 150–400)
RBC: 6.3 MIL/uL — ABNORMAL HIGH (ref 4.22–5.81)
RDW: 17.2 % — ABNORMAL HIGH (ref 11.5–15.5)
WBC Count: 7.1 10*3/uL (ref 4.0–10.5)
nRBC: 0 % (ref 0.0–0.2)

## 2021-08-29 LAB — CMP (CANCER CENTER ONLY)
ALT: 33 U/L (ref 0–44)
AST: 25 U/L (ref 15–41)
Albumin: 4.3 g/dL (ref 3.5–5.0)
Alkaline Phosphatase: 84 U/L (ref 38–126)
Anion gap: 7 (ref 5–15)
BUN: 18 mg/dL (ref 6–20)
CO2: 26 mmol/L (ref 22–32)
Calcium: 9.8 mg/dL (ref 8.9–10.3)
Chloride: 103 mmol/L (ref 98–111)
Creatinine: 1.22 mg/dL (ref 0.61–1.24)
GFR, Estimated: >60 mL/min (ref >60)
Glucose, Bld: 97 mg/dL (ref 70–99)
Potassium: 4.2 mmol/L (ref 3.5–5.1)
Sodium: 136 mmol/L (ref 135–145)
Total Bilirubin: 0.8 mg/dL (ref 0.3–1.2)
Total Protein: 6.6 g/dL (ref 6.5–8.1)

## 2021-08-29 LAB — IRON AND IRON BINDING CAPACITY (CC-WL,HP ONLY)
Iron: 56 ug/dL (ref 45–182)
Saturation Ratios: 14 % — ABNORMAL LOW (ref 17.9–39.5)
TIBC: 406 ug/dL (ref 250–450)
UIBC: 350 ug/dL (ref 117–376)

## 2021-08-29 LAB — FERRITIN: Ferritin: 9 ng/mL — ABNORMAL LOW (ref 24–336)

## 2021-08-29 NOTE — Patient Instructions (Signed)

## 2021-08-29 NOTE — Progress Notes (Signed)
Austin Poole presents today for phlebotomy per MD orders. Phlebotomy procedure started at 1210 via 16 gauge phlebotomy kit and ended at 1223. 534 grams removed. Patient observed for 30 minutes after procedure without any incident. Patient tolerated procedure well. IV needle removed intact.

## 2021-10-27 ENCOUNTER — Other Ambulatory Visit: Payer: 59

## 2021-10-27 ENCOUNTER — Ambulatory Visit: Payer: 59 | Admitting: Family

## 2021-10-31 ENCOUNTER — Inpatient Hospital Stay: Payer: 59 | Admitting: Family

## 2021-10-31 ENCOUNTER — Inpatient Hospital Stay: Payer: 59

## 2021-12-04 ENCOUNTER — Encounter: Payer: Self-pay | Admitting: Family

## 2021-12-05 ENCOUNTER — Encounter: Payer: Self-pay | Admitting: Family

## 2021-12-12 ENCOUNTER — Inpatient Hospital Stay: Payer: Commercial Managed Care - HMO | Attending: Family | Admitting: Family

## 2021-12-12 ENCOUNTER — Encounter: Payer: Self-pay | Admitting: Family

## 2021-12-12 ENCOUNTER — Inpatient Hospital Stay: Payer: Commercial Managed Care - HMO

## 2021-12-12 ENCOUNTER — Other Ambulatory Visit: Payer: Self-pay

## 2021-12-12 VITALS — BP 128/86 | HR 73 | Temp 98.2°F | Resp 18 | Ht 70.0 in | Wt 236.0 lb

## 2021-12-12 VITALS — BP 118/94 | HR 72

## 2021-12-12 DIAGNOSIS — D751 Secondary polycythemia: Secondary | ICD-10-CM | POA: Insufficient documentation

## 2021-12-12 DIAGNOSIS — D500 Iron deficiency anemia secondary to blood loss (chronic): Secondary | ICD-10-CM

## 2021-12-12 DIAGNOSIS — Z7982 Long term (current) use of aspirin: Secondary | ICD-10-CM | POA: Diagnosis not present

## 2021-12-12 DIAGNOSIS — Z87891 Personal history of nicotine dependence: Secondary | ICD-10-CM | POA: Insufficient documentation

## 2021-12-12 LAB — CMP (CANCER CENTER ONLY)
ALT: 36 U/L (ref 0–44)
AST: 25 U/L (ref 15–41)
Albumin: 4.6 g/dL (ref 3.5–5.0)
Alkaline Phosphatase: 97 U/L (ref 38–126)
Anion gap: 8 (ref 5–15)
BUN: 22 mg/dL — ABNORMAL HIGH (ref 6–20)
CO2: 28 mmol/L (ref 22–32)
Calcium: 10.6 mg/dL — ABNORMAL HIGH (ref 8.9–10.3)
Chloride: 104 mmol/L (ref 98–111)
Creatinine: 1.13 mg/dL (ref 0.61–1.24)
GFR, Estimated: >60 mL/min (ref >60)
Glucose, Bld: 103 mg/dL — ABNORMAL HIGH (ref 70–99)
Potassium: 5.6 mmol/L — ABNORMAL HIGH (ref 3.5–5.1)
Sodium: 140 mmol/L (ref 135–145)
Total Bilirubin: 0.9 mg/dL (ref 0.3–1.2)
Total Protein: 7.6 g/dL (ref 6.5–8.1)

## 2021-12-12 LAB — FERRITIN: Ferritin: 14 ng/mL — ABNORMAL LOW (ref 24–336)

## 2021-12-12 LAB — CBC WITH DIFFERENTIAL (CANCER CENTER ONLY)
Abs Immature Granulocytes: 0.03 10*3/uL (ref 0.00–0.07)
Basophils Absolute: 0 10*3/uL (ref 0.0–0.1)
Basophils Relative: 1 %
Eosinophils Absolute: 0.3 10*3/uL (ref 0.0–0.5)
Eosinophils Relative: 4 %
HCT: 54.4 % — ABNORMAL HIGH (ref 39.0–52.0)
Hemoglobin: 17.8 g/dL — ABNORMAL HIGH (ref 13.0–17.0)
Immature Granulocytes: 0 %
Lymphocytes Relative: 29 %
Lymphs Abs: 2.4 10*3/uL (ref 0.7–4.0)
MCH: 26.4 pg (ref 26.0–34.0)
MCHC: 32.7 g/dL (ref 30.0–36.0)
MCV: 80.7 fL (ref 80.0–100.0)
Monocytes Absolute: 0.7 10*3/uL (ref 0.1–1.0)
Monocytes Relative: 9 %
Neutro Abs: 4.8 10*3/uL (ref 1.7–7.7)
Neutrophils Relative %: 57 %
Platelet Count: 253 10*3/uL (ref 150–400)
RBC: 6.74 MIL/uL — ABNORMAL HIGH (ref 4.22–5.81)
RDW: 18 % — ABNORMAL HIGH (ref 11.5–15.5)
WBC Count: 8.4 10*3/uL (ref 4.0–10.5)
nRBC: 0 % (ref 0.0–0.2)

## 2021-12-12 LAB — IRON AND IRON BINDING CAPACITY (CC-WL,HP ONLY)
Iron: 110 ug/dL (ref 45–182)
Saturation Ratios: 25 % (ref 17.9–39.5)
TIBC: 448 ug/dL (ref 250–450)
UIBC: 338 ug/dL (ref 117–376)

## 2021-12-12 NOTE — Progress Notes (Signed)
Hematology and Oncology Follow Up Visit  Austin Poole [id number] 11/28/1985 36 y.o. 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 is here today with his wife for follow-up. Hct today is 54%.  He has stopped smoking.  He plans to restart the baby aspirin daily.  No blood loss, abnormal bruising or petechiae noted.  No issues with infections. No fever, chills, n/v, cough, rash, dizziness, headaches, vision changes, SOB, chest pain, palpitations, abdominal pain or changes in bowel or bladder habits.  No swelling, tenderness, numbness or tingling in his extremities at this time.   No falls or syncope.  He stays active going to the gym regularly.  Appetite and hydration are good. Weight is stable at 236 lbs.   ECOG Performance Status: 0 - Asymptomatic  Medications:  Allergies as of 12/12/2021   No Known Allergies      Medication List        Accurate as of December 12, 2021 12:49 PM. If you have any questions, ask your nurse or doctor.          aspirin 81 MG chewable tablet Chew by mouth.   cetirizine 10 MG tablet Commonly known as: ZYRTEC Take 1 tablet (10 mg total) by mouth daily.   fluticasone 50 MCG/ACT nasal spray Commonly known as: FLONASE Place 2 sprays into both nostrils daily.   GaviLAX 17 GM/SCOOP powder Generic drug: polyethylene glycol powder TAKE 17 GRAMS MIXED WITH 8OZ WATER, JUICE, SODA, COFFEE OR TEA BY MOUTH ONE TIME DAILY FOR 30 DAYS   lisinopril-hydrochlorothiazide 20-12.5 MG tablet Commonly known as: ZESTORETIC Take 1 tablet by mouth daily.   omeprazole 20 MG capsule Commonly known as: PRILOSEC Take 1 capsule (20 mg total) by mouth daily. What changed: Another medication with the same name was removed. Continue  taking this medication, and follow the directions you see here. Changed by: Lottie Dawson, NP        Allergies: No Known Allergies  Past Medical History, Surgical history, Social history, and Family History were reviewed and updated.  Review of Systems: All other 10 point review of systems is negative.   Physical Exam:  height is '5\' 10"'$  (1.778 m) and weight is 236 lb (107 kg). His oral temperature is 98.2 F (36.8 C). His blood pressure is 128/86 and his pulse is 73. His respiration is 18 and oxygen saturation is 100%.   Wt Readings from Last 3 Encounters:  12/12/21 236 lb (107 kg)  06/27/21 235 lb 1.9 oz (106.6 kg)  05/09/21 235 lb (106.6 kg)    Ocular: Sclerae unicteric, pupils equal, round and reactive to light Ear-nose-throat: Oropharynx clear, dentition fair Lymphatic: No cervical or supraclavicular adenopathy Lungs no rales or rhonchi, good excursion bilaterally Heart regular rate and rhythm, no murmur appreciated Abd soft, nontender, positive bowel sounds MSK no focal spinal tenderness, no joint edema Neuro: non-focal, well-oriented, appropriate affect Breasts: Deferred   Lab Results  Component Value Date   WBC 8.4 12/12/2021   HGB 17.8 (H) 12/12/2021   HCT 54.4 (H) 12/12/2021   MCV 80.7 12/12/2021   PLT 253 12/12/2021   Lab Results  Component Value Date   FERRITIN 9 (L) 08/29/2021   IRON 56 08/29/2021  TIBC 406 08/29/2021   UIBC 350 08/29/2021   IRONPCTSAT 14 (L) 08/29/2021   Lab Results  Component Value Date   RETICCTPCT 1.5 06/27/2021   RBC 6.74 (H) 12/12/2021   RETICCTABS 103.2 04/28/2014   No results found for: "KPAFRELGTCHN", "LAMBDASER", "KAPLAMBRATIO" No results found for: "IGGSERUM", "IGA", "IGMSERUM" No results found for: "TOTALPROTELP", "ALBUMINELP", "A1GS", "A2GS", "BETS", "BETA2SER", "GAMS", "MSPIKE", "SPEI"   Chemistry      Component Value Date/Time   NA 140 12/12/2021 1203   NA 140 12/12/2016 1420   NA 137 08/17/2015 1305   K 5.6  (H) 12/12/2021 1203   K 3.9 12/12/2016 1420   K 4.4 08/17/2015 1305   CL 104 12/12/2021 1203   CL 106 12/12/2016 1420   CO2 28 12/12/2021 1203   CO2 25 12/12/2016 1420   CO2 21 (L) 08/17/2015 1305   BUN 22 (H) 12/12/2021 1203   BUN 16 12/12/2016 1420   BUN 16.2 08/17/2015 1305   CREATININE 1.13 12/12/2021 1203   CREATININE 1.4 (H) 12/12/2016 1420   CREATININE 1.0 08/17/2015 1305      Component Value Date/Time   CALCIUM 10.6 (H) 12/12/2021 1203   CALCIUM 9.5 12/12/2016 1420   CALCIUM 9.3 08/17/2015 1305   ALKPHOS 97 12/12/2021 1203   ALKPHOS 112 (H) 12/12/2016 1420   ALKPHOS 126 08/17/2015 1305   AST 25 12/12/2021 1203   AST 25 08/17/2015 1305   ALT 36 12/12/2021 1203   ALT 48 (H) 12/12/2016 1420   ALT 40 08/17/2015 1305   BILITOT 0.9 12/12/2021 1203   BILITOT 0.56 08/17/2015 1305       Impression and Plan: [name] is a pleasant 36 yo [place] gentleman with secondary polycythemia due to smoking.  Phlebotomy today, Hct 54.4%.  Lab check and phlebotomy in 2 months, follow-up in 4 months.   Lottie Dawson, NP 11/7/202312:49 PM

## 2021-12-12 NOTE — Progress Notes (Signed)
1 unit phlebotomy performed over 20 minutes using a 16 gauge phlebotomy set via the right AC. Patient tolerated well. Nourishment provided. Patient does not want to stay for the 30 minute post phlebotomy. VSS. Patient discharged ambulatory without complaints or concerns.

## 2021-12-12 NOTE — Patient Instructions (Signed)

## 2022-01-26 ENCOUNTER — Encounter (HOSPITAL_BASED_OUTPATIENT_CLINIC_OR_DEPARTMENT_OTHER): Payer: Self-pay | Admitting: Emergency Medicine

## 2022-01-26 ENCOUNTER — Other Ambulatory Visit: Payer: Self-pay

## 2022-01-26 ENCOUNTER — Emergency Department (HOSPITAL_BASED_OUTPATIENT_CLINIC_OR_DEPARTMENT_OTHER)
Admission: EM | Admit: 2022-01-26 | Discharge: 2022-01-26 | Disposition: A | Discharge status: Home / Self Care | Payer: Commercial Managed Care - HMO | Attending: Emergency Medicine | Admitting: Emergency Medicine

## 2022-01-26 DIAGNOSIS — I10 Essential (primary) hypertension: Secondary | ICD-10-CM | POA: Insufficient documentation

## 2022-01-26 DIAGNOSIS — F17210 Nicotine dependence, cigarettes, uncomplicated: Secondary | ICD-10-CM | POA: Insufficient documentation

## 2022-01-26 DIAGNOSIS — Z79899 Other long term (current) drug therapy: Secondary | ICD-10-CM | POA: Insufficient documentation

## 2022-01-26 DIAGNOSIS — M79622 Pain in left upper arm: Secondary | ICD-10-CM | POA: Diagnosis not present

## 2022-01-26 DIAGNOSIS — M79601 Pain in right arm: Secondary | ICD-10-CM

## 2022-01-26 DIAGNOSIS — M25512 Pain in left shoulder: Secondary | ICD-10-CM | POA: Diagnosis present

## 2022-01-26 DIAGNOSIS — Z7982 Long term (current) use of aspirin: Secondary | ICD-10-CM | POA: Diagnosis not present

## 2022-01-26 DIAGNOSIS — M79621 Pain in right upper arm: Secondary | ICD-10-CM | POA: Insufficient documentation

## 2022-01-26 DIAGNOSIS — R200 Anesthesia of skin: Secondary | ICD-10-CM | POA: Insufficient documentation

## 2022-01-26 NOTE — ED Provider Notes (Signed)
[facility] EMERGENC[facility] DEPARTMENT Provider Note   CSN: [id number] Arrival date & time: 01/26/22  1825     History  Chief Complaint  Patient presents with   Shoulder Pain    Austin Poole is a 36 y.o. male.  The history is provided by the patient, the spouse and medical records. No language interpreter was used.  Shoulder Pain    36 year old male significant history of GERD, hypertension, polycythemia vera presenting complaining of arm pain.  Patient report earlier in the day he experiencing pain numbness and cold sensation to his left arm.  He states his symptom is probably from his elbow down to his wrist lasting for a few hours but has since improved.  Now he noticed somewhat of a similar symptoms to his right arm but he states it is mild.  He did not endorse any associate chest pain, headedness, dizziness, neck pain, shortness of breath abdominal pain or back pain.  He recalls lifting a heavy case of drinks earlier in the day and thought it may contribute to his symptoms.  He also reports working out several days prior.  He is here at the urging of his wife.  States that there is family history of cardiac disease but no history of premature cardiac death.  He admits to tobacco use and smoke about 3 to 4 cigarettes a day.  He denies alcohol use.  No specific treatment tried.  Home Medications Prior to Admission medications   Medication Sig Start Date End Date Taking? Authorizing Provider  aspirin 81 MG chewable tablet Chew by mouth. 12/30/20   [provider]  cetirizine (ZYRTEC) 10 MG tablet Take 1 tablet (10 mg total) by mouth daily. 04/19/21   Saguier, Percell Miller, PA-C  fluticasone (FLONASE) 50 MCG/ACT nasal spray Place 2 sprays into both nostrils daily. 04/19/21   Saguier, Percell Miller, PA-C  GAVILAX 17 GM/SCOOP powder TAKE 17 GRAMS MIXED WITH 8OZ WATER, JUICE, SODA, COFFEE OR TEA BY MOUTH ONE TIME DAILY FOR 30 DAYS 06/02/18   [provider]   lisinopril-hydrochlorothiazide (ZESTORETIC) 20-12.5 MG tablet Take 1 tablet by mouth daily. 02/03/21   [provider]  omeprazole (PRILOSEC) 20 MG capsule Take 1 capsule (20 mg total) by mouth daily. 04/19/21   Saguier, Percell Miller, PA-C      Allergies    Patient has no known allergies.    Review of Systems   Review of Systems  All other systems reviewed and are negative.   Physical Exam Updated Vital Signs BP (!) 139/95   Pulse 84   Temp 98.1 F (36.7 C) (Oral)   Resp 15   Ht '5\' 10"'$  (1.778 m)   Wt 106.6 kg   SpO2 98%   BMI 33.72 kg/m  Physical Exam Vitals and nursing note reviewed.  Constitutional:      General: He is not in acute distress.    Appearance: He is well-developed.  HENT:     Head: Atraumatic.  Eyes:     Conjunctiva/sclera: Conjunctivae normal.  Cardiovascular:     Rate and Rhythm: Normal rate and regular rhythm.     Pulses: Normal pulses.     Heart sounds: Normal heart sounds.  Pulmonary:     Effort: Pulmonary effort is normal.     Breath sounds: Normal breath sounds. No wheezing, rhonchi or rales.  Chest:     Chest wall: No tenderness.  Abdominal:     Palpations: Abdomen is soft.     Tenderness: There is no abdominal  tenderness.  Musculoskeletal:     Cervical back: Normal range of motion and neck supple. No tenderness.     Comments: 5 out of 5 strength to bilateral upper extremities with intact radial pulses.  Skin:    Capillary Refill: Capillary refill takes less than 2 seconds.     Findings: No rash.  Neurological:     Mental Status: He is alert. Mental status is at baseline.     ED Results / Procedures / Treatments   Labs (all labs ordered are listed, but only abnormal results are displayed) Labs Reviewed - No data to display  EKG None  Date: 01/26/2022  Rate: 85  Rhythm: normal sinus rhythm  QRS Axis: normal  Intervals: normal  ST/T Wave abnormalities: normal  Conduction Disutrbances: none  Narrative Interpretation:   Old  EKG Reviewed: No significant changes noted    Radiology No results found.  Procedures Procedures  85  Medications Ordered in ED Medications - No data to display  ED Course/ Medical Decision Making/ A&P                           Medical Decision Making  BP (!) 139/95   Pulse 84   Temp 98.1 F (36.7 C) (Oral)   Resp 15   Ht '5\' 10"'$  (1.778 m)   Wt 106.6 kg   SpO2 98%   BMI 33.72 kg/m   24:66 PM  36 year old male significant history of GERD, hypertension, polycythemia vera presenting complaining of arm pain.  Patient report earlier in the day he experiencing pain numbness and cold sensation to his left arm.  He states his symptom is probably from his elbow down to his wrist lasting for a few hours but has since improved.  Now he noticed somewhat of a similar symptoms to his right arm but he states it is mild.  He did not endorse any associate chest pain, headedness, dizziness, neck pain, shortness of breath abdominal pain or back pain.  He recalls lifting a heavy case of drinks earlier in the day and thought it may contribute to his symptoms.  He also reports working out several days prior.  He is here at the urging of his wife.  States that there is family history of cardiac disease but no history of premature cardiac death.  He admits to tobacco use and smoke about 3 to 4 cigarettes a day.  He denies alcohol use.  No specific treatment tried.  On exam this is a well-appearing male resting comfortably appears to be in no acute discomfort.  Heart with normal rate and rhythm, lungs are clear on auscultation.  No cervical midline spine tenderness.  Neck with full range of motion.  He has equal 5 out of 5 strength to bilateral upper extremities with intact radial pulse.  He has normal strength and sensation to his upper extremities.  He does not have any radicular neck discomfort that I can reproduce.  His presentation is not consistent with ACS.  EKG reviewed interpreted by me and normal  with normal sinus rhythm and no concerning ischemic changes.  After reassurance, and discussion, patient agrees for the cardiac workup is not indicated at this time.  I gave patient strict return precaution.  At this time he is stable for discharge.  Social determinant health including tobacco use, smoking cessation recommended.         Final Clinical Impression(s) / ED Diagnoses Final diagnoses:  Pain in  both upper extremities    Rx / DC Orders ED Discharge Orders     None         Domenic Moras, PA-C 01/26/22 2130    Tretha Sciara, MD 01/29/22 [phone]

## 2022-01-26 NOTE — ED Notes (Signed)
Dc instructions reviewed with pt no questions or concerns at this time.

## 2022-01-26 NOTE — Discharge Instructions (Signed)
You have been evaluated for your symptoms.  Your arm discomfort is likely due to heavy lifting and recent workout regimen.  I have low suspicion that this is a heart attack.  EKG that was obtained today appears normal.  You may follow-up with your primary care doctor for further care but if you develop chest pain, trouble breathing, progressive weakness of your arm or if you have any other concern please return to the ER for further evaluation.

## 2022-01-26 NOTE — ED Triage Notes (Signed)
Pt and wife report acute onset L arm pain and "numbness" around 1630/1700. Pt reports sx were intermittent and only occurred with movement. Pt reports lifting a heavy case of drinks earlier in the day. Pt denies sx presently. Denies chest pain or SHOB at any point today.

## 2022-01-30 ENCOUNTER — Inpatient Hospital Stay: Payer: Commercial Managed Care - HMO | Admitting: Family

## 2022-01-30 ENCOUNTER — Inpatient Hospital Stay: Payer: Commercial Managed Care - HMO | Attending: Family

## 2022-01-30 ENCOUNTER — Inpatient Hospital Stay: Payer: Commercial Managed Care - HMO

## 2022-01-30 ENCOUNTER — Encounter: Payer: Self-pay | Admitting: Family

## 2022-01-30 VITALS — BP 121/88 | HR 90 | Temp 98.4°F | Resp 18

## 2022-01-30 DIAGNOSIS — D751 Secondary polycythemia: Secondary | ICD-10-CM

## 2022-01-30 DIAGNOSIS — D500 Iron deficiency anemia secondary to blood loss (chronic): Secondary | ICD-10-CM | POA: Diagnosis not present

## 2022-01-30 LAB — CBC WITH DIFFERENTIAL (CANCER CENTER ONLY)
Abs Immature Granulocytes: 0.03 10*3/uL (ref 0.00–0.07)
Basophils Absolute: 0 10*3/uL (ref 0.0–0.1)
Basophils Relative: 1 %
Eosinophils Absolute: 0.4 10*3/uL (ref 0.0–0.5)
Eosinophils Relative: 5 %
HCT: 51.6 % (ref 39.0–52.0)
Hemoglobin: 17.4 g/dL — ABNORMAL HIGH (ref 13.0–17.0)
Immature Granulocytes: 0 %
Lymphocytes Relative: 31 %
Lymphs Abs: 2.5 10*3/uL (ref 0.7–4.0)
MCH: 27.3 pg (ref 26.0–34.0)
MCHC: 33.7 g/dL (ref 30.0–36.0)
MCV: 80.9 fL (ref 80.0–100.0)
Monocytes Absolute: 0.6 10*3/uL (ref 0.1–1.0)
Monocytes Relative: 7 %
Neutro Abs: 4.5 10*3/uL (ref 1.7–7.7)
Neutrophils Relative %: 56 %
Platelet Count: 223 10*3/uL (ref 150–400)
RBC: 6.38 MIL/uL — ABNORMAL HIGH (ref 4.22–5.81)
RDW: 16.1 % — ABNORMAL HIGH (ref 11.5–15.5)
WBC Count: 8.1 10*3/uL (ref 4.0–10.5)
nRBC: 0 % (ref 0.0–0.2)

## 2022-01-30 LAB — CMP (CANCER CENTER ONLY)
ALT: 36 U/L (ref 0–44)
AST: 23 U/L (ref 15–41)
Albumin: 4.2 g/dL (ref 3.5–5.0)
Alkaline Phosphatase: 93 U/L (ref 38–126)
Anion gap: 10 (ref 5–15)
BUN: 23 mg/dL — ABNORMAL HIGH (ref 6–20)
CO2: 23 mmol/L (ref 22–32)
Calcium: 9.8 mg/dL (ref 8.9–10.3)
Chloride: 106 mmol/L (ref 98–111)
Creatinine: 1.25 mg/dL — ABNORMAL HIGH (ref 0.61–1.24)
GFR, Estimated: >60 mL/min (ref >60)
Glucose, Bld: 120 mg/dL — ABNORMAL HIGH (ref 70–99)
Potassium: 4.2 mmol/L (ref 3.5–5.1)
Sodium: 139 mmol/L (ref 135–145)
Total Bilirubin: 0.6 mg/dL (ref 0.3–1.2)
Total Protein: 6.7 g/dL (ref 6.5–8.1)

## 2022-01-30 LAB — FERRITIN: Ferritin: 15 ng/mL — ABNORMAL LOW (ref 24–336)

## 2022-01-30 LAB — IRON AND IRON BINDING CAPACITY (CC-WL,HP ONLY)
Iron: 61 ug/dL (ref 45–182)
Saturation Ratios: 14 % — ABNORMAL LOW (ref 17.9–39.5)
TIBC: 438 ug/dL (ref 250–450)
UIBC: 377 ug/dL — ABNORMAL HIGH (ref 117–376)

## 2022-01-30 NOTE — Patient Instructions (Signed)

## 2022-01-30 NOTE — Progress Notes (Signed)
Austin Poole presents today for phlebotomy per MD orders. Phlebotomy procedure started at 1328 and ended at 1333. 546 cc removed via 16 G needle at [facility] site. Patient tolerated procedure well.

## 2022-01-30 NOTE — Progress Notes (Signed)
Error

## 2022-02-13 ENCOUNTER — Other Ambulatory Visit: Payer: Commercial Managed Care - HMO

## 2022-03-02 ENCOUNTER — Telehealth: Payer: Self-pay | Admitting: Geriatric Medicine

## 2022-03-02 MED ORDER — LISINOPRIL-HYDROCHLOROTHIAZIDE 20-12.5 MG PO TABS: 1.0000 | ORAL_TABLET | Freq: Every day | ORAL | 0 refills | Status: DC

## 2022-03-02 NOTE — Telephone Encounter (Signed)
Rx sent 

## 2022-03-02 NOTE — Telephone Encounter (Signed)
Patient's wife called and said patient went to dentist and his blood pressure was high (bottom number 104) and they said patient needs to be back on his bp medication. Patient's insurance required him to change his PCP but the appt for new doctor is not until 2/15 so wife would like to know if Percell Miller is willing to call in a refill until they are able to get in with new doctor.   lisinopril-hydrochlorothiazide (ZESTORETIC) 20-12.5 MG tablet   Please send [facility] #[id number] - [place], [place] - [address] AT [address]

## 2022-04-03 ENCOUNTER — Encounter: Payer: Self-pay | Admitting: Family

## 2022-04-09 ENCOUNTER — Other Ambulatory Visit: Payer: Self-pay | Admitting: Medical

## 2022-04-09 ENCOUNTER — Encounter: Payer: Self-pay | Admitting: *Deleted

## 2022-04-10 ENCOUNTER — Inpatient Hospital Stay: Payer: Self-pay

## 2022-04-10 ENCOUNTER — Inpatient Hospital Stay: Payer: Self-pay | Attending: Family | Admitting: Family

## 2022-04-25 ENCOUNTER — Encounter: Payer: Self-pay | Admitting: *Deleted
